# Patient Record
Sex: Female | Born: 1955 | Race: White | Hispanic: No | Smoking: Former smoker
Health system: Southern US, Community
[De-identification: ages and names within clinical notes are randomized; demographics above are authoritative.]

## PROBLEM LIST (undated history)

## (undated) ENCOUNTER — Emergency Department (HOSPITAL_BASED_OUTPATIENT_CLINIC_OR_DEPARTMENT_OTHER): Payer: BC Managed Care – PPO | Source: Home / Self Care

## (undated) DIAGNOSIS — Z8489 Family history of other specified conditions: Secondary | ICD-10-CM

## (undated) DIAGNOSIS — Z9889 Other specified postprocedural states: Secondary | ICD-10-CM

## (undated) DIAGNOSIS — R112 Nausea with vomiting, unspecified: Secondary | ICD-10-CM

## (undated) DIAGNOSIS — M1990 Unspecified osteoarthritis, unspecified site: Secondary | ICD-10-CM

## (undated) DIAGNOSIS — T8859XA Other complications of anesthesia, initial encounter: Secondary | ICD-10-CM

## (undated) DIAGNOSIS — J189 Pneumonia, unspecified organism: Secondary | ICD-10-CM

## (undated) HISTORY — PX: OTHER SURGICAL HISTORY: SHX169

## (undated) HISTORY — PX: COLONOSCOPY: SHX174

## (undated) HISTORY — PX: HIP FRACTURE SURGERY: SHX118

## (undated) HISTORY — DX: Unspecified osteoarthritis, unspecified site: M19.90

---

## 1997-07-10 ENCOUNTER — Other Ambulatory Visit: Admission: RE | Admit: 1997-07-10 | Discharge: 1997-07-10 | Payer: Self-pay | Admitting: *Deleted

## 1997-09-26 ENCOUNTER — Ambulatory Visit (HOSPITAL_COMMUNITY): Admission: RE | Admit: 1997-09-26 | Discharge: 1997-09-26 | Payer: Self-pay | Admitting: *Deleted

## 1998-04-23 ENCOUNTER — Encounter: Payer: Self-pay | Admitting: *Deleted

## 1998-04-23 ENCOUNTER — Ambulatory Visit (HOSPITAL_COMMUNITY): Admission: RE | Admit: 1998-04-23 | Discharge: 1998-04-23 | Payer: Self-pay | Admitting: *Deleted

## 1998-07-31 ENCOUNTER — Other Ambulatory Visit: Admission: RE | Admit: 1998-07-31 | Discharge: 1998-07-31 | Payer: Self-pay | Admitting: *Deleted

## 2000-02-05 ENCOUNTER — Other Ambulatory Visit: Admission: RE | Admit: 2000-02-05 | Discharge: 2000-02-05 | Payer: Self-pay | Admitting: *Deleted

## 2000-08-19 ENCOUNTER — Other Ambulatory Visit: Admission: RE | Admit: 2000-08-19 | Discharge: 2000-08-19 | Payer: Self-pay | Admitting: *Deleted

## 2000-10-01 ENCOUNTER — Other Ambulatory Visit: Admission: RE | Admit: 2000-10-01 | Discharge: 2000-10-01 | Payer: Self-pay | Admitting: *Deleted

## 2001-03-31 ENCOUNTER — Other Ambulatory Visit: Admission: RE | Admit: 2001-03-31 | Discharge: 2001-03-31 | Payer: Self-pay | Admitting: *Deleted

## 2001-04-21 ENCOUNTER — Encounter: Admission: RE | Admit: 2001-04-21 | Discharge: 2001-04-21 | Payer: Self-pay | Admitting: *Deleted

## 2001-04-21 ENCOUNTER — Encounter: Payer: Self-pay | Admitting: *Deleted

## 2002-03-08 ENCOUNTER — Other Ambulatory Visit: Admission: RE | Admit: 2002-03-08 | Discharge: 2002-03-08 | Payer: Self-pay | Admitting: *Deleted

## 2002-06-01 ENCOUNTER — Encounter: Admission: RE | Admit: 2002-06-01 | Discharge: 2002-06-01 | Payer: Self-pay | Admitting: *Deleted

## 2003-06-07 ENCOUNTER — Encounter: Admission: RE | Admit: 2003-06-07 | Discharge: 2003-06-07 | Payer: Self-pay | Admitting: *Deleted

## 2003-06-13 ENCOUNTER — Other Ambulatory Visit: Admission: RE | Admit: 2003-06-13 | Discharge: 2003-06-13 | Payer: Self-pay | Admitting: *Deleted

## 2004-01-26 ENCOUNTER — Encounter (INDEPENDENT_AMBULATORY_CARE_PROVIDER_SITE_OTHER): Payer: Self-pay | Admitting: Specialist

## 2004-01-29 ENCOUNTER — Ambulatory Visit (HOSPITAL_COMMUNITY): Admission: RE | Admit: 2004-01-29 | Discharge: 2004-01-29 | Payer: Self-pay | Admitting: *Deleted

## 2004-06-13 ENCOUNTER — Encounter: Admission: RE | Admit: 2004-06-13 | Discharge: 2004-06-13 | Payer: Self-pay | Admitting: *Deleted

## 2005-06-18 ENCOUNTER — Encounter: Admission: RE | Admit: 2005-06-18 | Discharge: 2005-06-18 | Payer: Self-pay | Admitting: *Deleted

## 2006-03-16 ENCOUNTER — Emergency Department (HOSPITAL_COMMUNITY): Admission: EM | Admit: 2006-03-16 | Discharge: 2006-03-16 | Payer: Self-pay | Admitting: *Deleted

## 2006-06-22 ENCOUNTER — Encounter: Admission: RE | Admit: 2006-06-22 | Discharge: 2006-06-22 | Payer: Self-pay | Admitting: Obstetrics and Gynecology

## 2007-02-23 ENCOUNTER — Ambulatory Visit: Payer: Self-pay | Admitting: Gastroenterology

## 2007-03-03 ENCOUNTER — Ambulatory Visit: Payer: Self-pay | Admitting: Gastroenterology

## 2007-06-23 ENCOUNTER — Encounter: Admission: RE | Admit: 2007-06-23 | Discharge: 2007-06-23 | Payer: Self-pay | Admitting: Obstetrics & Gynecology

## 2007-07-01 ENCOUNTER — Encounter: Admission: RE | Admit: 2007-07-01 | Discharge: 2007-07-01 | Payer: Self-pay | Admitting: Obstetrics & Gynecology

## 2008-07-31 ENCOUNTER — Encounter: Admission: RE | Admit: 2008-07-31 | Discharge: 2008-07-31 | Payer: Self-pay | Admitting: Obstetrics & Gynecology

## 2008-08-08 ENCOUNTER — Encounter: Admission: RE | Admit: 2008-08-08 | Discharge: 2008-08-08 | Payer: Self-pay | Admitting: Obstetrics & Gynecology

## 2009-08-09 ENCOUNTER — Encounter: Admission: RE | Admit: 2009-08-09 | Discharge: 2009-08-09 | Payer: Self-pay | Admitting: Obstetrics & Gynecology

## 2010-02-10 ENCOUNTER — Encounter: Payer: Self-pay | Admitting: Obstetrics & Gynecology

## 2010-06-07 NOTE — Op Note (Signed)
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   POSTOPERATIVE DIAGNOSES:  1.  Menorrhagia.  2.  Large uterine myoma.   PROCEDURES:  1.  Examination under anesthesia.  2.  Fractional dilatation and curettage.  3.  Diagnostic hysteroscopy.  4.  Placement of a Merina IUD.   INDICATIONS FOR THE PROCEDURE:  Mary [name] is 55 years old.  She is  having regular menstrual periods with seven days flow, one to two of them  are very, very heavy.  She has been changing a pad every hour.  We performed  a hydrosonogram in the office on September 01, 2003, and she was found to have  a very thickened endometrium.  In preparation for this procedure, she had a  menstrual period and is brought to the operating room today for [facility],  hysteroscopy and placement of a Merina IUD to try and help control her  bleeding.   FINDINGS:  As noted, in the office the patient's cervix was very high on the  anterior abdominal wall, requiring pressure on the posterior myoma and  pressure on the anterior abdominal wall to bring this cervix into view.  The  endometrial canal was quite distorted and was ultimately found to be 13 cm  in diameter by hysteroscopy rather than sounding.  There was lush  endometrium which was obtained by curettage.  There were no abnormal  findings other than this massive fibroid filling the cul-de-sac and the  posterior wall of the uterus.  There was no evidence of submucosal myoma or  intraluminal myoma.   DESCRIPTION OF PROCEDURE:  Mary [name] was brought to the operating room  with  an IV in place.  She received a gram of Ancef in the holding area.  Supine on the OR table, LMA was placed for the deliverance of general  anesthesia.  She was then placed into Allen stirrups.  Using Betadine, the  anterior abdominal wall, perineum and vagina were prepped carefully and the  bladder was drained with a red rubber catheter.  Sterile drapes were  applied.   Examination under anesthesia was performed.  A gloved attendant helped  position the cervix after I placed firm pressure on the posterior myoma and  anterior wall.  This allowed [place] to visualize the cervix, which was grasped  with a single-tooth tenaculum.  With this in vision, a weighted vaginal  speculum was placed and Marcaine was injected into the cervix at 3, 4, 7 and  8 positions using a total volume of 10 mL.  A Kevorkian curet was used to  obtain endocervical curettings.  The sound then passed to a depth of 7 cm in  a distorted  position.  The cervix was then dilated to size 13 Pratt dilator  and the hysteroscope was introduced.  I could see a cavity above 7 cm and  with careful manipulation was able to get into the cavity for visualization  of the endometrial cavity.  Photographs were taken to document the positions  of the ostia.  The hysteroscope was then used to mark the depth of the  cavity and retrieved.  The cavity was 13 cm in depth.  The Merina IUD was  opened and then inserted using a standard procedure.  However, because the  depth exceeded the depth of the Merina I measured an addition depth on the  length of the [place] and advanced the [place] IUD to the fundus.  It was  withdrawn 2 cm and then disengaged.  The apparatus was removed and the  string was cut about 5 cm long.  It is possible that this will retract more  and I will cut it when she comes into the office for her visit.  The  procedure was uncomplicated.  Specimens include endocervical curettings and  endometrial curettings.     Maur    MAJ/MEDQ  D:  01/29/2004  T:  01/29/2004  Job:  [id number]

## 2010-07-09 ENCOUNTER — Other Ambulatory Visit: Payer: Self-pay | Admitting: Obstetrics & Gynecology

## 2010-07-09 DIAGNOSIS — Z1231 Encounter for screening mammogram for malignant neoplasm of breast: Secondary | ICD-10-CM

## 2010-09-03 ENCOUNTER — Ambulatory Visit
Admission: RE | Admit: 2010-09-03 | Discharge: 2010-09-03 | Disposition: A | Discharge status: Home / Self Care | Payer: BC Managed Care – PPO | Source: Ambulatory Visit | Attending: Obstetrics & Gynecology | Admitting: Obstetrics & Gynecology

## 2010-09-03 DIAGNOSIS — Z1231 Encounter for screening mammogram for malignant neoplasm of breast: Secondary | ICD-10-CM

## 2011-07-30 ENCOUNTER — Other Ambulatory Visit: Payer: Self-pay | Admitting: Obstetrics & Gynecology

## 2011-07-30 DIAGNOSIS — Z1231 Encounter for screening mammogram for malignant neoplasm of breast: Secondary | ICD-10-CM

## 2011-09-15 ENCOUNTER — Ambulatory Visit
Admission: RE | Admit: 2011-09-15 | Discharge: 2011-09-15 | Disposition: A | Discharge status: Home / Self Care | Payer: BC Managed Care – PPO | Source: Ambulatory Visit | Attending: Obstetrics & Gynecology | Admitting: Obstetrics & Gynecology

## 2011-09-15 DIAGNOSIS — Z1231 Encounter for screening mammogram for malignant neoplasm of breast: Secondary | ICD-10-CM

## 2012-02-10 ENCOUNTER — Encounter: Payer: Self-pay | Admitting: Gastroenterology

## 2012-08-16 ENCOUNTER — Other Ambulatory Visit: Payer: Self-pay

## 2012-08-16 DIAGNOSIS — Z1231 Encounter for screening mammogram for malignant neoplasm of breast: Secondary | ICD-10-CM

## 2012-08-17 ENCOUNTER — Encounter: Payer: Self-pay | Admitting: Gastroenterology

## 2012-09-23 ENCOUNTER — Ambulatory Visit: Payer: BC Managed Care – PPO

## 2012-10-14 ENCOUNTER — Ambulatory Visit
Admission: RE | Admit: 2012-10-14 | Discharge: 2012-10-14 | Disposition: A | Discharge status: Home / Self Care | Payer: BC Managed Care – PPO | Source: Ambulatory Visit

## 2012-10-14 DIAGNOSIS — Z1231 Encounter for screening mammogram for malignant neoplasm of breast: Secondary | ICD-10-CM

## 2013-12-08 ENCOUNTER — Other Ambulatory Visit: Payer: Self-pay

## 2013-12-08 DIAGNOSIS — Z1231 Encounter for screening mammogram for malignant neoplasm of breast: Secondary | ICD-10-CM

## 2013-12-26 ENCOUNTER — Ambulatory Visit
Admission: RE | Admit: 2013-12-26 | Discharge: 2013-12-26 | Disposition: A | Discharge status: Home / Self Care | Payer: BC Managed Care – PPO | Source: Ambulatory Visit

## 2013-12-26 DIAGNOSIS — Z1231 Encounter for screening mammogram for malignant neoplasm of breast: Secondary | ICD-10-CM

## 2014-11-22 ENCOUNTER — Other Ambulatory Visit: Payer: Self-pay

## 2014-11-22 DIAGNOSIS — Z1231 Encounter for screening mammogram for malignant neoplasm of breast: Secondary | ICD-10-CM

## 2014-12-28 ENCOUNTER — Ambulatory Visit
Admission: RE | Admit: 2014-12-28 | Discharge: 2014-12-28 | Disposition: A | Discharge status: Home / Self Care | Payer: BLUE CROSS/BLUE SHIELD | Source: Ambulatory Visit

## 2014-12-28 DIAGNOSIS — Z1231 Encounter for screening mammogram for malignant neoplasm of breast: Secondary | ICD-10-CM

## 2015-02-12 ENCOUNTER — Encounter: Payer: Self-pay | Admitting: Gastroenterology

## 2015-11-12 DIAGNOSIS — Z23 Encounter for immunization: Secondary | ICD-10-CM | POA: Diagnosis not present

## 2015-12-04 ENCOUNTER — Other Ambulatory Visit: Payer: Self-pay | Admitting: Obstetrics & Gynecology

## 2015-12-04 DIAGNOSIS — Z1231 Encounter for screening mammogram for malignant neoplasm of breast: Secondary | ICD-10-CM

## 2016-01-09 ENCOUNTER — Ambulatory Visit: Payer: BLUE CROSS/BLUE SHIELD

## 2016-01-28 DIAGNOSIS — Z6822 Body mass index (BMI) 22.0-22.9, adult: Secondary | ICD-10-CM | POA: Diagnosis not present

## 2016-01-28 DIAGNOSIS — Z1151 Encounter for screening for human papillomavirus (HPV): Secondary | ICD-10-CM | POA: Diagnosis not present

## 2016-01-28 DIAGNOSIS — Z01419 Encounter for gynecological examination (general) (routine) without abnormal findings: Secondary | ICD-10-CM | POA: Diagnosis not present

## 2016-01-30 ENCOUNTER — Encounter: Payer: Self-pay | Admitting: Gastroenterology

## 2016-02-08 ENCOUNTER — Ambulatory Visit: Payer: BLUE CROSS/BLUE SHIELD

## 2016-02-14 ENCOUNTER — Ambulatory Visit (AMBULATORY_SURGERY_CENTER): Payer: Self-pay | Admitting: *Deleted

## 2016-02-14 DIAGNOSIS — Z800 Family history of malignant neoplasm of digestive organs: Secondary | ICD-10-CM

## 2016-02-14 MED ORDER — NA SULFATE-K SULFATE-MG SULF 17.5-3.13-1.6 GM/177ML PO SOLN: ORAL | 0 refills | Status: DC

## 2016-02-14 NOTE — Progress Notes (Signed)
Pt denies allergies to eggs or soy products. Denies difficulty with sedation or anesthesia. Denies any diet or weight loss medications. Denies use of supplemental oxygen.  Emmi instructions given for procedure.  

## 2016-02-15 ENCOUNTER — Encounter: Payer: Self-pay | Admitting: Gastroenterology

## 2016-02-28 ENCOUNTER — Encounter: Payer: Self-pay | Admitting: Gastroenterology

## 2016-02-28 ENCOUNTER — Ambulatory Visit (AMBULATORY_SURGERY_CENTER): Payer: BLUE CROSS/BLUE SHIELD | Admitting: Gastroenterology

## 2016-02-28 VITALS — BP 115/57 | HR 69 | Temp 96.6°F | Resp 18

## 2016-02-28 DIAGNOSIS — Z1212 Encounter for screening for malignant neoplasm of rectum: Secondary | ICD-10-CM

## 2016-02-28 DIAGNOSIS — Z800 Family history of malignant neoplasm of digestive organs: Secondary | ICD-10-CM | POA: Diagnosis not present

## 2016-02-28 DIAGNOSIS — D122 Benign neoplasm of ascending colon: Secondary | ICD-10-CM | POA: Diagnosis not present

## 2016-02-28 DIAGNOSIS — D124 Benign neoplasm of descending colon: Secondary | ICD-10-CM | POA: Diagnosis not present

## 2016-02-28 DIAGNOSIS — Z1211 Encounter for screening for malignant neoplasm of colon: Secondary | ICD-10-CM | POA: Diagnosis not present

## 2016-02-28 MED ORDER — SODIUM CHLORIDE 0.9 % IV SOLN: 500.0000 mL | INTRAVENOUS | Status: DC

## 2016-02-28 NOTE — Patient Instructions (Signed)
Impression/Recommendations:  Polyp handout given to patient. Hemorrhoid handout given to patient.  Repeat colonoscopy in 3 years for surveillance based on pathology results.  YOU HAD AN ENDOSCOPIC PROCEDURE TODAY AT [facility]:   Refer to the procedure report that was given to you for any specific questions about what was found during the examination.  If the procedure report does not answer your questions, please call your gastroenterologist to clarify.  If you requested that your care partner not be given the details of your procedure findings, then the procedure report has been included in a sealed envelope for you to review at your convenience later.  YOU SHOULD EXPECT: Some feelings of bloating in the abdomen. Passage of more gas than usual.  Walking can help get rid of the air that was put into your GI tract during the procedure and reduce the bloating. If you had a lower endoscopy (such as a colonoscopy or flexible sigmoidoscopy) you may notice spotting of blood in your stool or on the toilet paper. If you underwent a bowel prep for your procedure, you may not have a normal bowel movement for a few days.  Please Note:  You might notice some irritation and congestion in your nose or some drainage.  This is from the oxygen used during your procedure.  There is no need for concern and it should clear up in a day or so.  SYMPTOMS TO REPORT IMMEDIATELY:   Following lower endoscopy (colonoscopy or flexible sigmoidoscopy):  Excessive amounts of blood in the stool  Significant tenderness or worsening of abdominal pains  Swelling of the abdomen that is new, acute  Fever of 100F or higher For urgent or emergent issues, a gastroenterologist can be reached at any hour by calling [phone].   DIET:  We do recommend a small meal at first, but then you may proceed to your regular diet.  Drink plenty of fluids but you should avoid alcoholic beverages for 24 hours.  ACTIVITY:   You should plan to take it easy for the rest of today and you should NOT DRIVE or use heavy machinery until tomorrow (because of the sedation medicines used during the test).    FOLLOW UP: Our staff will call the number listed on your records the next business day following your procedure to check on you and address any questions or concerns that you may have regarding the information given to you following your procedure. If we do not reach you, we will leave a message.  However, if you are feeling well and you are not experiencing any problems, there is no need to return our call.  We will assume that you have returned to your regular daily activities without incident.  If any biopsies were taken you will be contacted by phone or by letter within the next 1-3 weeks.  Please call us at [phone] if you have not heard about the biopsies in 3 weeks.    SIGNATURES/CONFIDENTIALITY: You and/or your care partner have signed paperwork which will be entered into your electronic medical record.  These signatures attest to the fact that that the information above on your After Visit Summary has been reviewed and is understood.  Full responsibility of the confidentiality of this discharge information lies with you and/or your care-partner.

## 2016-02-28 NOTE — Progress Notes (Signed)
Called to room to assist during endoscopic procedure.  Patient ID and intended procedure confirmed with present staff. Received instructions for my participation in the procedure from the performing physician.  

## 2016-02-28 NOTE — Progress Notes (Signed)
To recovery, report to Sechler, RN, VSS 

## 2016-02-28 NOTE — Op Note (Signed)
Mary [name] Patient Name: Mary [name] Procedure Date: 02/28/2016 8:08 AM MRN: [id number] Endoscopist: Mauri Pole , MD Age: 61 Referring MD:  Date of Birth: Aug 02, 1955 Gender: Female Account #: [id number] Procedure:                Colonoscopy Indications:              Screening in patient at increased risk: Family                            history of 1st-degree relative with colorectal                            cancer before age 25 years Medicines:                Monitored Anesthesia Care Procedure:                Pre-Anesthesia Assessment:                           - Prior to the procedure, a History and Physical                            was performed, and patient medications and                            allergies were reviewed. The patient's tolerance of                            previous anesthesia was also reviewed. The risks                            and benefits of the procedure and the sedation                            options and risks were discussed with the patient.                            All questions were answered, and informed consent                            was obtained. Prior Anticoagulants: The patient has                            taken no previous anticoagulant or antiplatelet                            agents. ASA Grade Assessment: II - A patient with                            mild systemic disease. After reviewing the risks                            and benefits, the patient was deemed in  satisfactory condition to undergo the procedure.                           After obtaining informed consent, the colonoscope                            was passed under direct vision. Throughout the                            procedure, the patient's blood pressure, pulse, and                            oxygen saturations were monitored continuously. The                            Model CF-HQ190L [id number]) scope  was introduced                            through the anus and advanced to the the terminal                            ileum, with identification of the appendiceal                            orifice and IC valve. The colonoscopy was performed                            without difficulty. The patient tolerated the                            procedure well. The quality of the bowel                            preparation was excellent. The terminal ileum,                            ileocecal valve, appendiceal orifice, and rectum                            were photographed. Scope In: 8:12:05 AM Scope Out: 8:38:55 AM Scope Withdrawal Time: 0 hours 12 minutes 24 seconds  Total Procedure Duration: 0 hours 26 minutes 50 seconds  Findings:                 The perianal and digital rectal examinations were                            normal.                           Three sessile polyps were found in the descending                            colon and ascending colon. The polyps were 5 to 12  mm in size. These polyps were removed with a cold                            snare. Resection and retrieval were complete.                           Non-bleeding internal hemorrhoids were found during                            retroflexion. The hemorrhoids were small.                           The exam was otherwise without abnormality. Complications:            No immediate complications. Estimated Blood Loss:     Estimated blood loss was minimal. Impression:               - Three 5 to 12 mm polyps in the descending colon                            and in the ascending colon, removed with a cold                            snare. Resected and retrieved.                           - Non-bleeding internal hemorrhoids.                           - The examination was otherwise normal. Recommendation:           - Patient has a contact number available for                             emergencies. The signs and symptoms of potential                            delayed complications were discussed with the                            patient. Return to normal activities tomorrow.                            Written discharge instructions were provided to the                            patient.                           - Resume previous diet.                           - Continue present medications.                           - Await pathology results.                           -  Repeat colonoscopy in 3 years for surveillance                            based on pathology results. Mauri Pole, MD 02/28/2016 8:50:26 AM This report has been signed electronically.

## 2016-02-28 NOTE — Progress Notes (Signed)
Pt's states no medical or surgical changes since previsit or office visit.  No home oxygen used or hx of sleep apnea 

## 2016-02-29 ENCOUNTER — Telehealth: Payer: Self-pay | Admitting: *Deleted

## 2016-02-29 NOTE — Telephone Encounter (Signed)
  Follow up Call-  Call back number 02/28/2016  Post procedure Call Back phone  # [phone]  Permission to leave phone message Yes  Some recent data might be hidden     No answer at # given.  Left message on VM.

## 2016-03-03 ENCOUNTER — Ambulatory Visit
Admission: RE | Admit: 2016-03-03 | Discharge: 2016-03-03 | Disposition: A | Discharge status: Home / Self Care | Payer: BLUE CROSS/BLUE SHIELD | Source: Ambulatory Visit | Attending: Obstetrics & Gynecology | Admitting: Obstetrics & Gynecology

## 2016-03-03 ENCOUNTER — Telehealth: Payer: Self-pay | Admitting: *Deleted

## 2016-03-03 DIAGNOSIS — Z1231 Encounter for screening mammogram for malignant neoplasm of breast: Secondary | ICD-10-CM

## 2016-03-03 NOTE — Telephone Encounter (Signed)
Second call back attempted no answer. SM

## 2016-03-04 ENCOUNTER — Encounter: Payer: Self-pay | Admitting: Gastroenterology

## 2017-01-03 DIAGNOSIS — H6123 Impacted cerumen, bilateral: Secondary | ICD-10-CM | POA: Diagnosis not present

## 2017-01-05 ENCOUNTER — Ambulatory Visit: Payer: Self-pay | Admitting: Physician Assistant

## 2017-01-12 DIAGNOSIS — H900 Conductive hearing loss, bilateral: Secondary | ICD-10-CM | POA: Diagnosis not present

## 2017-01-12 DIAGNOSIS — H6123 Impacted cerumen, bilateral: Secondary | ICD-10-CM | POA: Diagnosis not present

## 2017-02-12 ENCOUNTER — Other Ambulatory Visit: Payer: Self-pay | Admitting: Obstetrics and Gynecology

## 2017-02-12 DIAGNOSIS — Z1231 Encounter for screening mammogram for malignant neoplasm of breast: Secondary | ICD-10-CM

## 2017-03-05 ENCOUNTER — Ambulatory Visit
Admission: RE | Admit: 2017-03-05 | Discharge: 2017-03-05 | Disposition: A | Discharge status: Home / Self Care | Payer: BLUE CROSS/BLUE SHIELD | Source: Ambulatory Visit | Attending: Obstetrics and Gynecology | Admitting: Obstetrics and Gynecology

## 2017-03-05 DIAGNOSIS — Z1231 Encounter for screening mammogram for malignant neoplasm of breast: Secondary | ICD-10-CM | POA: Diagnosis not present

## 2017-04-24 DIAGNOSIS — Z01419 Encounter for gynecological examination (general) (routine) without abnormal findings: Secondary | ICD-10-CM | POA: Diagnosis not present

## 2017-04-24 DIAGNOSIS — Z6821 Body mass index (BMI) 21.0-21.9, adult: Secondary | ICD-10-CM | POA: Diagnosis not present

## 2018-01-26 ENCOUNTER — Other Ambulatory Visit: Payer: Self-pay | Admitting: Obstetrics and Gynecology

## 2018-01-26 DIAGNOSIS — Z1231 Encounter for screening mammogram for malignant neoplasm of breast: Secondary | ICD-10-CM

## 2018-03-09 ENCOUNTER — Ambulatory Visit
Admission: RE | Admit: 2018-03-09 | Discharge: 2018-03-09 | Disposition: A | Discharge status: Home / Self Care | Payer: BLUE CROSS/BLUE SHIELD | Source: Ambulatory Visit | Attending: Obstetrics and Gynecology | Admitting: Obstetrics and Gynecology

## 2018-03-09 DIAGNOSIS — Z1231 Encounter for screening mammogram for malignant neoplasm of breast: Secondary | ICD-10-CM | POA: Diagnosis not present

## 2018-10-23 DIAGNOSIS — Z1159 Encounter for screening for other viral diseases: Secondary | ICD-10-CM | POA: Diagnosis not present

## 2018-10-23 DIAGNOSIS — J029 Acute pharyngitis, unspecified: Secondary | ICD-10-CM | POA: Diagnosis not present

## 2019-02-03 ENCOUNTER — Other Ambulatory Visit: Payer: Self-pay | Admitting: Obstetrics and Gynecology

## 2019-02-03 DIAGNOSIS — Z1231 Encounter for screening mammogram for malignant neoplasm of breast: Secondary | ICD-10-CM

## 2019-03-16 ENCOUNTER — Ambulatory Visit: Payer: BLUE CROSS/BLUE SHIELD

## 2019-03-29 DIAGNOSIS — Z20822 Contact with and (suspected) exposure to covid-19: Secondary | ICD-10-CM | POA: Diagnosis not present

## 2019-06-27 ENCOUNTER — Other Ambulatory Visit: Payer: Self-pay

## 2019-06-27 ENCOUNTER — Ambulatory Visit
Admission: RE | Admit: 2019-06-27 | Discharge: 2019-06-27 | Disposition: A | Discharge status: Home / Self Care | Payer: BC Managed Care – PPO | Source: Ambulatory Visit | Attending: Obstetrics and Gynecology | Admitting: Obstetrics and Gynecology

## 2019-06-27 DIAGNOSIS — Z1231 Encounter for screening mammogram for malignant neoplasm of breast: Secondary | ICD-10-CM | POA: Diagnosis not present

## 2019-06-29 ENCOUNTER — Other Ambulatory Visit: Payer: Self-pay | Admitting: Obstetrics and Gynecology

## 2019-06-29 DIAGNOSIS — R928 Other abnormal and inconclusive findings on diagnostic imaging of breast: Secondary | ICD-10-CM

## 2019-07-04 ENCOUNTER — Other Ambulatory Visit: Payer: Self-pay | Admitting: Obstetrics and Gynecology

## 2019-07-04 ENCOUNTER — Other Ambulatory Visit: Payer: Self-pay

## 2019-07-04 ENCOUNTER — Ambulatory Visit
Admission: RE | Admit: 2019-07-04 | Discharge: 2019-07-04 | Disposition: A | Discharge status: Home / Self Care | Payer: BC Managed Care – PPO | Source: Ambulatory Visit | Attending: Obstetrics and Gynecology | Admitting: Obstetrics and Gynecology

## 2019-07-04 DIAGNOSIS — R921 Mammographic calcification found on diagnostic imaging of breast: Secondary | ICD-10-CM | POA: Diagnosis not present

## 2019-07-04 DIAGNOSIS — R928 Other abnormal and inconclusive findings on diagnostic imaging of breast: Secondary | ICD-10-CM

## 2019-09-08 DIAGNOSIS — Z01419 Encounter for gynecological examination (general) (routine) without abnormal findings: Secondary | ICD-10-CM | POA: Diagnosis not present

## 2019-09-08 DIAGNOSIS — Z6820 Body mass index (BMI) 20.0-20.9, adult: Secondary | ICD-10-CM | POA: Diagnosis not present

## 2019-09-08 DIAGNOSIS — Z1151 Encounter for screening for human papillomavirus (HPV): Secondary | ICD-10-CM | POA: Diagnosis not present

## 2019-12-03 ENCOUNTER — Ambulatory Visit: Payer: Self-pay | Attending: Internal Medicine

## 2019-12-03 DIAGNOSIS — Z23 Encounter for immunization: Secondary | ICD-10-CM

## 2019-12-03 NOTE — Progress Notes (Signed)
   Covid-19 Vaccination Clinic  Name:  Mary [name]    MRN: [id number] DOB: [birth year]  12/03/2019  Ms. Mary [name] was observed post Covid-19 immunization for 15 minutes without incident. She was provided with Vaccine Information Sheet and instruction to access the V-Safe system.   Ms. Mary [name] was instructed to call 911 with any severe reactions post vaccine: Marland Kitchen Difficulty breathing  . Swelling of face and throat  . A fast heartbeat  . A bad rash all over body  . Dizziness and weakness   Immunizations Administered    Name Date Dose VIS Date Route   Pfizer COVID-19 Vaccine 12/03/2019  1:53 PM 0.3 mL 11/09/2019 Intramuscular   Manufacturer: [place]   Lot: [id number]   [place]: [id number]

## 2020-02-22 DIAGNOSIS — H11153 Pinguecula, bilateral: Secondary | ICD-10-CM | POA: Diagnosis not present

## 2020-02-22 DIAGNOSIS — H25813 Combined forms of age-related cataract, bilateral: Secondary | ICD-10-CM | POA: Diagnosis not present

## 2020-03-22 ENCOUNTER — Encounter: Payer: Self-pay | Admitting: Gastroenterology

## 2020-03-30 DIAGNOSIS — Z20822 Contact with and (suspected) exposure to covid-19: Secondary | ICD-10-CM | POA: Diagnosis not present

## 2020-04-03 ENCOUNTER — Other Ambulatory Visit: Payer: Self-pay | Admitting: Obstetrics and Gynecology

## 2020-04-03 ENCOUNTER — Ambulatory Visit
Admission: RE | Admit: 2020-04-03 | Discharge: 2020-04-03 | Disposition: A | Discharge status: Home / Self Care | Payer: BC Managed Care – PPO | Source: Ambulatory Visit | Attending: Obstetrics and Gynecology | Admitting: Obstetrics and Gynecology

## 2020-04-03 ENCOUNTER — Other Ambulatory Visit: Payer: Self-pay

## 2020-04-03 DIAGNOSIS — R921 Mammographic calcification found on diagnostic imaging of breast: Secondary | ICD-10-CM | POA: Diagnosis not present

## 2020-04-03 DIAGNOSIS — R928 Other abnormal and inconclusive findings on diagnostic imaging of breast: Secondary | ICD-10-CM

## 2020-04-10 ENCOUNTER — Ambulatory Visit (AMBULATORY_SURGERY_CENTER): Payer: Self-pay | Admitting: *Deleted

## 2020-04-10 ENCOUNTER — Other Ambulatory Visit: Payer: Self-pay

## 2020-04-10 VITALS — Ht 62.0 in | Wt 121.0 lb

## 2020-04-10 DIAGNOSIS — Z800 Family history of malignant neoplasm of digestive organs: Secondary | ICD-10-CM

## 2020-04-10 DIAGNOSIS — Z86010 Personal history of colonic polyps: Secondary | ICD-10-CM

## 2020-04-10 MED ORDER — SUPREP BOWEL PREP KIT 17.5-3.13-1.6 GM/177ML PO SOLN: 1.0000 | Freq: Once | ORAL | 0 refills | Status: AC

## 2020-04-10 NOTE — Progress Notes (Signed)
  No trouble with anesthesia, denies being told they were difficult to intubate, or hx/fam hx of malignant hyperthermia per pt   No egg or soy allergy  No home oxygen use   No medications for weight loss taken  Pt denies constipation issues  Pt informed that we do not do prior authorizations for prep  Suprep coupon given

## 2020-04-24 ENCOUNTER — Encounter: Payer: Self-pay | Admitting: Gastroenterology

## 2020-04-26 ENCOUNTER — Ambulatory Visit (AMBULATORY_SURGERY_CENTER): Payer: BC Managed Care – PPO | Admitting: Gastroenterology

## 2020-04-26 ENCOUNTER — Other Ambulatory Visit: Payer: Self-pay

## 2020-04-26 ENCOUNTER — Encounter: Payer: Self-pay | Admitting: Gastroenterology

## 2020-04-26 VITALS — BP 133/76 | HR 66 | Temp 97.1°F | Resp 17 | Ht 62.0 in | Wt 121.0 lb

## 2020-04-26 DIAGNOSIS — D122 Benign neoplasm of ascending colon: Secondary | ICD-10-CM

## 2020-04-26 DIAGNOSIS — Z86010 Personal history of colonic polyps: Secondary | ICD-10-CM

## 2020-04-26 DIAGNOSIS — Z800 Family history of malignant neoplasm of digestive organs: Secondary | ICD-10-CM | POA: Diagnosis not present

## 2020-04-26 DIAGNOSIS — Z1211 Encounter for screening for malignant neoplasm of colon: Secondary | ICD-10-CM | POA: Diagnosis not present

## 2020-04-26 DIAGNOSIS — D123 Benign neoplasm of transverse colon: Secondary | ICD-10-CM

## 2020-04-26 MED ORDER — SODIUM CHLORIDE 0.9 % IV SOLN: 500.0000 mL | Freq: Once | INTRAVENOUS | Status: DC

## 2020-04-26 NOTE — Progress Notes (Signed)
To PACU, VSS. Report to [email or web address] 

## 2020-04-26 NOTE — Op Note (Signed)
[name] Procedure Date: 04/26/2020 8:05 AM MRN: [id number] Endoscopist: Mauri Pole , MD Age: 65 Referring MD:  Date of Birth: Apr 08, 1955 Gender: Female Account #: [id number] Procedure:                Colonoscopy Indications:              Screening in patient at increased risk: Family                            history of 1st-degree relative with colorectal                            cancer, High risk colon cancer surveillance:                            Personal history of colonic polyps, High risk colon                            cancer surveillance: Personal history of adenoma                            (10 mm or greater in size), High risk colon cancer                            surveillance: Personal history of multiple (3 or                            more) adenomas Medicines:                Monitored Anesthesia Care Procedure:                Pre-Anesthesia Assessment:                           - Prior to the procedure, a History and Physical                            was performed, and patient medications and                            allergies were reviewed. The patient's tolerance of                            previous anesthesia was also reviewed. The risks                            and benefits of the procedure and the sedation                            options and risks were discussed with the patient.                            All questions were answered, and informed consent  was obtained. Prior Anticoagulants: The patient has                            taken no previous anticoagulant or antiplatelet                            agents. ASA Grade Assessment: II - A patient with                            mild systemic disease. After reviewing the risks                            and benefits, the patient was deemed in                            satisfactory condition to undergo the procedure.                            After obtaining informed consent, the colonoscope                            was passed under direct vision. Throughout the                            procedure, the patient's blood pressure, pulse, and                            oxygen saturations were monitored continuously. The                            Olympus PCF-[id number] (#[id number]) Colonoscope was                            introduced through the anus and advanced to the the                            cecum, identified by appendiceal orifice and                            ileocecal valve. The colonoscopy was performed                            without difficulty. The patient tolerated the                            procedure well. The quality of the bowel                            preparation was excellent. The ileocecal valve,                            appendiceal orifice, and rectum were photographed. Scope In: 8:13:01 AM Scope Out: 8:29:32 AM Scope Withdrawal Time: 0 hours 11 minutes 25 seconds  Total Procedure Duration: 0  hours 16 minutes 31 seconds  Findings:                 The perianal and digital rectal examinations were                            normal.                           Four sessile polyps were found in the transverse                            colon and ascending colon. The polyps were 5 to 8                            mm in size. These polyps were removed with a cold                            snare. Resection and retrieval were complete.                           Non-bleeding external and internal hemorrhoids were                            found during retroflexion. The hemorrhoids were                            medium-sized. Complications:            No immediate complications. Estimated Blood Loss:     Estimated blood loss was minimal. Impression:               - Four 5 to 8 mm polyps in the transverse colon and                            in the ascending colon, removed with a cold  snare.                            Resected and retrieved.                           - Non-bleeding external and internal hemorrhoids. Recommendation:           - Patient has a contact number available for                            emergencies. The signs and symptoms of potential                            delayed complications were discussed with the                            patient. Return to normal activities tomorrow.                            Written discharge instructions were provided to the  patient.                           - Resume previous diet.                           - Continue present medications.                           - Await pathology results.                           - Repeat colonoscopy in 3 years for surveillance                            based on pathology results. Mauri Pole, MD 04/26/2020 8:34:51 AM This report has been signed electronically.

## 2020-04-26 NOTE — Progress Notes (Signed)
Pt's states no medical or surgical changes since previsit or office visit.  VS taken by [place]

## 2020-04-26 NOTE — Progress Notes (Signed)
Called to room to assist during endoscopic procedure.  Patient ID and intended procedure confirmed with present staff. Received instructions for my participation in the procedure from the performing physician.  

## 2020-04-26 NOTE — Patient Instructions (Signed)
YOU HAD AN ENDOSCOPIC PROCEDURE TODAY AT THE [facility]:   Refer to the procedure report that was given to you for any specific questions about what was found during the examination.  If the procedure report does not answer your questions, please call your gastroenterologist to clarify.  If you requested that your care partner not be given the details of your procedure findings, then the procedure report has been included in a sealed envelope for you to review at your convenience later.  YOU SHOULD EXPECT: Some feelings of bloating in the abdomen. Passage of more gas than usual.  Walking can help get rid of the air that was put into your GI tract during the procedure and reduce the bloating. If you had a lower endoscopy (such as a colonoscopy or flexible sigmoidoscopy) you may notice spotting of blood in your stool or on the toilet paper. If you underwent a bowel prep for your procedure, you may not have a normal bowel movement for a few days.  Please Note:  You might notice some irritation and congestion in your nose or some drainage.  This is from the oxygen used during your procedure.  There is no need for concern and it should clear up in a day or so.  SYMPTOMS TO REPORT IMMEDIATELY:   Following lower endoscopy (colonoscopy or flexible sigmoidoscopy):  Excessive amounts of blood in the stool  Significant tenderness or worsening of abdominal pains  Swelling of the abdomen that is new, acute  Fever of 100F or higher  For urgent or emergent issues, a gastroenterologist can be reached at any hour by calling [phone]. Do not use MyChart messaging for urgent concerns.    DIET:  We do recommend a small meal at first, but then you may proceed to your regular diet.  Drink plenty of fluids but you should avoid alcoholic beverages for 24 hours.  ACTIVITY:  You should plan to take it easy for the rest of today and you should NOT DRIVE or use heavy machinery until tomorrow (because  of the sedation medicines used during the test).    FOLLOW UP: Our staff will call the number listed on your records 48-72 hours following your procedure to check on you and address any questions or concerns that you may have regarding the information given to you following your procedure. If we do not reach you, we will leave a message.  We will attempt to reach you two times.  During this call, we will ask if you have developed any symptoms of COVID 19. If you develop any symptoms (ie: fever, flu-like symptoms, shortness of breath, cough etc.) before then, please call [phone].  If you test positive for Covid 19 in the 2 weeks post procedure, please call and report this information to us.    If any biopsies were taken you will be contacted by phone or by letter within the next 1-3 weeks.  Please call us at [phone] if you have not heard about the biopsies in 3 weeks.    SIGNATURES/CONFIDENTIALITY: You and/or your care partner have signed paperwork which will be entered into your electronic medical record.  These signatures attest to the fact that that the information above on your After Visit Summary has been reviewed and is understood.  Full responsibility of the confidentiality of this discharge information lies with you and/or your care-partner. 

## 2020-04-30 ENCOUNTER — Telehealth: Payer: Self-pay | Admitting: *Deleted

## 2020-04-30 DIAGNOSIS — M5416 Radiculopathy, lumbar region: Secondary | ICD-10-CM | POA: Diagnosis not present

## 2020-04-30 DIAGNOSIS — M25551 Pain in right hip: Secondary | ICD-10-CM | POA: Diagnosis not present

## 2020-04-30 DIAGNOSIS — G8929 Other chronic pain: Secondary | ICD-10-CM | POA: Diagnosis not present

## 2020-04-30 DIAGNOSIS — M25552 Pain in left hip: Secondary | ICD-10-CM | POA: Diagnosis not present

## 2020-04-30 NOTE — Telephone Encounter (Signed)
Follow up call made. 

## 2020-04-30 NOTE — Telephone Encounter (Signed)
  Follow up Call-  Call back number 04/26/2020  Post procedure Call Back phone  # [phone]  Permission to leave phone message Yes  Some recent data might be hidden     Patient questions:  Do you have a fever, pain , or abdominal swelling? No. Pain Score  0 *  Have you tolerated food without any problems? Yes.    Have you been able to return to your normal activities? Yes.    Do you have any questions about your discharge instructions: Diet   No. Medications  No. Follow up visit  No.  Do you have questions or concerns about your Care? No.  Actions: * If pain score is 4 or above: No action needed, pain <4.  1. Have you developed a fever since your procedure? no  2.   Have you had an respiratory symptoms (SOB or cough) since your procedure? no  3.   Have you tested positive for COVID 19 since your procedure no  4.   Have you had any family members/close contacts diagnosed with the COVID 19 since your procedure?  no   If yes to any of these questions please route to Joylene John, RN and Joella Prince, RN

## 2020-05-15 DIAGNOSIS — M47816 Spondylosis without myelopathy or radiculopathy, lumbar region: Secondary | ICD-10-CM | POA: Diagnosis not present

## 2020-05-15 DIAGNOSIS — M5386 Other specified dorsopathies, lumbar region: Secondary | ICD-10-CM | POA: Diagnosis not present

## 2020-05-15 DIAGNOSIS — M4316 Spondylolisthesis, lumbar region: Secondary | ICD-10-CM | POA: Diagnosis not present

## 2020-05-15 DIAGNOSIS — M48061 Spinal stenosis, lumbar region without neurogenic claudication: Secondary | ICD-10-CM | POA: Diagnosis not present

## 2020-05-15 NOTE — Progress Notes (Signed)
[facility] [address] [place] Phone: [phone] Subjective:   I Mary [name] am serving as a Education administrator for Dr. Hulan Saas.  This visit occurred during the SARS-CoV-2 public health emergency.  Safety protocols were in place, including screening questions prior to the visit, additional usage of staff PPE, and extensive cleaning of exam room while observing appropriate contact time as indicated for disinfecting solutions.   I'm seeing this patient by the request  of:  Dr. Jannifer Franklin MD  CC: Back and hip pain  [id number]  Mary [name] is a 65 y.o. female coming in with complaint of back and bilateral hip pain. MRI yesterday. States that today it is right sided hip pain that radiates laterally that hurts the most.   Onset- Chronic  Location -  Duration- pain all the time Character- sharp  Aggravating factors- lifting, walking, horse riding  Reliving factors-  Therapies tried- her horse's prednisone 5 mg a day, ibuprofen  Severity- 5/10 at its worse      No past medical history on file. Past Surgical History:  Procedure Laterality Date  . COLONOSCOPY    . HIP FRACTURE SURGERY Left    Social History   Socioeconomic History  . Marital status: Divorced    Spouse name: Not on file  . Number of children: Not on file  . Years of education: Not on file  . Highest education level: Not on file  Occupational History  . Not on file  Tobacco Use  . Smoking status: Former Research scientist (life sciences)  . Smokeless tobacco: Never Used  Vaping Use  . Vaping Use: Never used  Substance and Sexual Activity  . Alcohol use: Yes    Alcohol/week: 7.0 standard drinks    Types: 7 Glasses of wine per week  . Drug use: No  . Sexual activity: Not on file  Other Topics Concern  . Not on file  Social History Narrative  . Not on file   Social Determinants of Health   Financial Resource Strain: Not on file  Food Insecurity: Not on file  Transportation Needs:  Not on file  Physical Activity: Not on file  Stress: Not on file  Social Connections: Not on file   No Known Allergies Family History  Problem Relation Age of Onset  . Colon cancer Mother   . Breast cancer Mother        early 90's  . Esophageal cancer Neg Hx   . Rectal cancer Neg Hx   . Stomach cancer Neg Hx     Current Outpatient Medications (Endocrine & Metabolic):  Marland Kitchen  PREDNISONE PO, Take by mouth. PRN inflammation     Current Outpatient Medications (Respiratory):  .  diphenhydrAMINE (BENADRYL) 25 MG tablet, Take 25 mg by mouth every 6 (six) hours as needed.   Current Outpatient Medications (Analgesics):  .  ibuprofen (ADVIL,MOTRIN) 200 MG tablet, Take 200 mg by mouth every 6 (six) hours as needed.     Current Outpatient Medications (Other):  Marland Kitchen  Black Cohosh-SoyIsoflav-Magnol (ESTROVEN MENOPAUSE RELIEF PO), Take by mouth. .  gabapentin (NEURONTIN) 100 MG capsule, Take 2 capsules (200 mg total) by mouth at bedtime. .  Nutritional Supplements (ESTROVEN PO), Take by mouth daily.  Current Facility-Administered Medications (Other):  .  0.9 %  sodium chloride infusion   Reviewed prior external information including notes and imaging from  primary care provider As well as notes that were available from care everywhere and other healthcare systems.  Past medical history, social, surgical and family history all reviewed in electronic medical record.  No pertanent information unless stated regarding to the chief complaint.   Review of Systems:  No headache, visual changes, nausea, vomiting, diarrhea, constipation, dizziness, abdominal pain, skin rash, fevers, chills, night sweats, weight loss, swollen lymph nodes, body aches, joint swelling, chest pain, shortness of breath, mood changes. POSITIVE muscle aches, body aches  Objective  Blood pressure 112/72, pulse 82, height 5\' 2"  (1.575 m), weight 120 lb (54.4 kg), SpO2 99 %.   General: No apparent distress alert and  oriented x3 mood and affect normal, dressed appropriately.  HEENT: Pupils equal, extraocular movements intact  Respiratory: Patient's speak in full sentences and does not appear short of breath  Cardiovascular: No lower extremity edema, non tender, no erythema  Gait significantly abnormal with patient swinging the right hip forward.  He does walk also with the right foot and external rotation. Right hip exam does show significant decreased range of motion in internal and external range of motion.  Only has 5 to 10 degrees of internal.  Negative straight leg test but patient does have some mild tightness of the hamstring compared to the contralateral side.  Deep tendon reflexes though are intact.  Tender to palpation more in the gluteal area than truly in the back itself. Lower back does show some loss of lordosis.  Some degenerative scoliosis noted. Patient does have some weakness noted with the hip flexor as well as knee extension on the right compared to the left.  Spent significant time with patient trying to review the MRI which we had difficulty, new x-rays of the hip, and discussing different treatment options with her as well as physical exam for greater than 46 minutes.   Impression and Recommendations:     The above documentation has been reviewed and is accurate and complete Lyndal Pulley, DO

## 2020-05-16 ENCOUNTER — Encounter: Payer: Self-pay | Admitting: Gastroenterology

## 2020-05-16 ENCOUNTER — Ambulatory Visit: Payer: BC Managed Care – PPO | Admitting: Family Medicine

## 2020-05-16 ENCOUNTER — Other Ambulatory Visit: Payer: Self-pay

## 2020-05-16 ENCOUNTER — Encounter: Payer: Self-pay | Admitting: Family Medicine

## 2020-05-16 ENCOUNTER — Ambulatory Visit (INDEPENDENT_AMBULATORY_CARE_PROVIDER_SITE_OTHER): Payer: BC Managed Care – PPO

## 2020-05-16 VITALS — BP 112/72 | HR 82 | Ht 62.0 in | Wt 120.0 lb

## 2020-05-16 DIAGNOSIS — M25551 Pain in right hip: Secondary | ICD-10-CM

## 2020-05-16 DIAGNOSIS — M1611 Unilateral primary osteoarthritis, right hip: Secondary | ICD-10-CM | POA: Diagnosis not present

## 2020-05-16 DIAGNOSIS — M5416 Radiculopathy, lumbar region: Secondary | ICD-10-CM

## 2020-05-16 DIAGNOSIS — Z8673 Personal history of transient ischemic attack (TIA), and cerebral infarction without residual deficits: Secondary | ICD-10-CM

## 2020-05-16 MED ORDER — GABAPENTIN 100 MG PO CAPS: 200.0000 mg | ORAL_CAPSULE | Freq: Every day | ORAL | 3 refills | Status: DC

## 2020-05-16 NOTE — Assessment & Plan Note (Signed)
Patient is having some lumbar radiculopathy.  Noticing the patient does have weakness of the right hip girdle.  Unfortunately found to have significant amount of arthritic changes of the right hip that is likely contributing to some of the abnormal gait.  Patient on x-rays of the hip also show that patient has a severely calcified uterus that is likely contributing to abnormality noted on x-ray that was reviewed today.  Encouraged her to follow-up with her gynecologist.  Patient denies any fevers, chills, any abnormal weight loss.  Patient did bring MRI CD and today that unfortunately we were unable to get pictures pulled up.  X-rays of the hips noted does show that patient does have significant degenerative disc disease of the lumbar spine that likely does contribute to more of a nerve impingement.  Patient will be started on gabapentin and once we do have a final read or pictures we can see we will consider the possibility of epidurals.  Patient then will continue with some home exercises at this point.  Follow-up with me again in 4 to 6 weeks.

## 2020-05-16 NOTE — Assessment & Plan Note (Signed)
Significant arthritis of the right hip.  Discussed with patient in great length about icing regimen and home exercises.  Patient has had fracture on the contralateral side with moderate arthritis as well.  Patient does have some weakness of the leg though that seems to be potentially secondary to more of the back but deep tendon reflexes are likely intact.  Patient will be referred to orthopedic surgery to discuss potential surgical intervention.

## 2020-05-16 NOTE — Patient Instructions (Addendum)
Good to see you Back exercises listen to your body Referral to [facility] for hip to discuss  I am waiting read for Mri and I will send you a message with plan afterwords Gabapentin 200 mg at night it can make you drowsy if so go to 1 pill a night  See me again in 6 weeks talk to gynecologist

## 2020-05-17 ENCOUNTER — Other Ambulatory Visit: Payer: Self-pay

## 2020-05-17 ENCOUNTER — Telehealth: Payer: Self-pay | Admitting: Family Medicine

## 2020-05-17 DIAGNOSIS — M1611 Unilateral primary osteoarthritis, right hip: Secondary | ICD-10-CM

## 2020-05-17 NOTE — Telephone Encounter (Signed)
[place] [phone]) from [place] called, pt is scheduled for MRI 5/3 but authorization is for [facility] and not [facility].

## 2020-05-18 NOTE — Telephone Encounter (Signed)
I have not even done a pre cert yet on this patient so I am not sure how it is approved for [facility] unless another place has done one. I will do the pre cert today for [facility] that was ordered by our office

## 2020-05-22 ENCOUNTER — Ambulatory Visit
Admission: RE | Admit: 2020-05-22 | Discharge: 2020-05-22 | Disposition: A | Discharge status: Home / Self Care | Payer: BC Managed Care – PPO | Source: Ambulatory Visit | Attending: Family Medicine | Admitting: Family Medicine

## 2020-05-22 ENCOUNTER — Other Ambulatory Visit: Payer: Self-pay

## 2020-05-22 DIAGNOSIS — M25551 Pain in right hip: Secondary | ICD-10-CM | POA: Diagnosis not present

## 2020-05-22 DIAGNOSIS — M1611 Unilateral primary osteoarthritis, right hip: Secondary | ICD-10-CM

## 2020-05-27 ENCOUNTER — Other Ambulatory Visit: Payer: BC Managed Care – PPO

## 2020-05-28 DIAGNOSIS — M1611 Unilateral primary osteoarthritis, right hip: Secondary | ICD-10-CM | POA: Diagnosis not present

## 2020-05-29 ENCOUNTER — Other Ambulatory Visit: Payer: Self-pay

## 2020-05-29 DIAGNOSIS — D259 Leiomyoma of uterus, unspecified: Secondary | ICD-10-CM | POA: Diagnosis not present

## 2020-05-30 ENCOUNTER — Ambulatory Visit (INDEPENDENT_AMBULATORY_CARE_PROVIDER_SITE_OTHER): Payer: BC Managed Care – PPO | Admitting: Family Medicine

## 2020-05-30 ENCOUNTER — Encounter: Payer: Self-pay | Admitting: Family Medicine

## 2020-05-30 ENCOUNTER — Ambulatory Visit (INDEPENDENT_AMBULATORY_CARE_PROVIDER_SITE_OTHER): Payer: BC Managed Care – PPO

## 2020-05-30 VITALS — BP 120/68 | HR 71 | Temp 97.4°F | Ht 62.0 in | Wt 119.2 lb

## 2020-05-30 DIAGNOSIS — Z0000 Encounter for general adult medical examination without abnormal findings: Secondary | ICD-10-CM | POA: Diagnosis not present

## 2020-05-30 DIAGNOSIS — H6123 Impacted cerumen, bilateral: Secondary | ICD-10-CM | POA: Diagnosis not present

## 2020-05-30 DIAGNOSIS — Z01818 Encounter for other preprocedural examination: Secondary | ICD-10-CM | POA: Diagnosis not present

## 2020-05-30 MED ORDER — DEBROX 6.5 % OT SOLN: 5.0000 [drp] | Freq: Two times a day (BID) | OTIC | 4 refills | Status: AC

## 2020-05-30 NOTE — Progress Notes (Addendum)
New Patient Office Visit  Subjective:  Patient ID: Mary [name], female    DOB: 1956-01-05  Age: 65 y.o. MRN: [id number]  CC:  Chief Complaint  Patient presents with  . Establish Care    NP/establish care upcoming hip surgery needing labs before surgery. Not fasting.     HPI Mary [name] presents for establishment of care and a preop evaluation for total hip placement scheduled for June 6.  Patient enjoys good health.  She has no chronic medical illnesses.  She has no history of CVA even though the medical record indicates that.  Status post recent colonoscopy.  She will be scheduled for Pap at the end of next month.  There is no injury history to the hip that is to be replaced, her right.  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  Quit smoking 34 years ago.  She averages 1 alcoholic drink daily.  Past Medical History:  Diagnosis Date  . Arthritis     Past Surgical History:  Procedure Laterality Date  . COLONOSCOPY    . HIP FRACTURE SURGERY Left     Family History  Problem Relation Age of Onset  . Colon cancer Mother   . Breast cancer Mother        early 57's  . Esophageal cancer Neg Hx   . Rectal cancer Neg Hx   . Stomach cancer Neg Hx     Social History   Socioeconomic History  . Marital status: Divorced    Spouse name: Not on file  . Number of children: Not on file  . Years of education: Not on file  . Highest education level: Not on file  Occupational History  . Not on file  Tobacco Use  . Smoking status: Former Smoker    Quit date: 01/20/1986    Years since quitting: 34.3  . Smokeless tobacco: Never Used  Vaping Use  . Vaping Use: Never used  Substance and Sexual Activity  . Alcohol use: Yes    Alcohol/week: 7.0 standard drinks    Types: 7 Glasses of wine per week  . Drug use: No  . Sexual activity: Not on file  Other Topics Concern  . Not on file  Social History Narrative  . Not on file   Social  Determinants of Health   Financial Resource Strain: Not on file  Food Insecurity: Not on file  Transportation Needs: Not on file  Physical Activity: Not on file  Stress: Not on file  Social Connections: Not on file  Intimate Partner Violence: Not on file    ROS Review of Systems  Constitutional: Negative.   HENT: Negative.   Eyes: Negative.   Respiratory: Negative.   Cardiovascular: Negative.   Gastrointestinal: Negative.   Endocrine: Negative for polyphagia and polyuria.  Genitourinary: Negative.   Musculoskeletal: Positive for arthralgias.  Allergic/Immunologic: Negative for immunocompromised state.  Neurological: Negative.   Hematological: Does not bruise/bleed easily.  Psychiatric/Behavioral: Negative.    Depression screen [facility] 2/9 05/30/2020  Decreased Interest 0  Down, Depressed, Hopeless 0  PHQ - 2 Score 0    Objective:   Today's Vitals: BP 120/68   Pulse 71   Temp (!) 97.4 F (36.3 C) (Temporal)   Ht 5\' 2"  (1.575 m)   Wt 119 lb 3.2 oz (54.1 kg)   SpO2 99%   BMI 21.80 kg/m   Physical Exam Vitals and nursing note reviewed.  Constitutional:  General: She is not in acute distress.    Appearance: Normal appearance. She is normal weight. She is not ill-appearing, toxic-appearing or diaphoretic.  HENT:     Head: Normocephalic and atraumatic.     Right Ear: External ear normal. There is no impacted cerumen.     Left Ear: External ear normal. There is no impacted cerumen.     Mouth/Throat:     Mouth: Mucous membranes are moist.     Pharynx: Oropharynx is clear. No oropharyngeal exudate or posterior oropharyngeal erythema.  Eyes:     General: No scleral icterus.       Right eye: No discharge.        Left eye: No discharge.     Extraocular Movements: Extraocular movements intact.     Conjunctiva/sclera: Conjunctivae normal.     Pupils: Pupils are equal, round, and reactive to light.  Neck:     Vascular: No carotid bruit.  Cardiovascular:     Rate and  Rhythm: Normal rate and regular rhythm.  Pulmonary:     Effort: Pulmonary effort is normal.     Breath sounds: Normal breath sounds.  Abdominal:     General: Bowel sounds are normal.  Musculoskeletal:     Cervical back: No rigidity or tenderness.     Right lower leg: No edema.     Left lower leg: No edema.  Lymphadenopathy:     Cervical: No cervical adenopathy.  Skin:    General: Skin is warm and dry.  Neurological:     Mental Status: She is alert and oriented to person, place, and time.  Psychiatric:        Mood and Affect: Mood normal.        Behavior: Behavior normal.     Assessment & Plan:   Problem List Items Addressed This Visit   None   Visit Diagnoses    Pre-op evaluation    -  Primary   Relevant Orders   CBC   Comprehensive metabolic panel   Lipid panel   Urinalysis, Routine w reflex microscopic   DG Chest 2 View   Healthcare maintenance       Relevant Orders   CBC   Comprehensive metabolic panel   Lipid panel   Urinalysis, Routine w reflex microscopic   Excessive cerumen in both ear canals       Relevant Medications   carbamide peroxide (DEBROX) 6.5 % OTIC solution      Outpatient Encounter Medications as of 05/30/2020  Medication Sig  . Black Cohosh-SoyIsoflav-Magnol (ESTROVEN MENOPAUSE RELIEF PO) Take by mouth.  . carbamide peroxide (DEBROX) 6.5 % OTIC solution Place 5 drops into both ears 2 (two) times daily.  . diphenhydrAMINE (BENADRYL) 25 MG tablet Take 25 mg by mouth every 6 (six) hours as needed.  . gabapentin (NEURONTIN) 100 MG capsule Take 2 capsules (200 mg total) by mouth at bedtime.  Marland Kitchen ibuprofen (ADVIL,MOTRIN) 200 MG tablet Take 200 mg by mouth every 6 (six) hours as needed.  . Nutritional Supplements (ESTROVEN PO) Take by mouth daily.  Marland Kitchen PREDNISONE PO Take by mouth. PRN inflammation   Facility-Administered Encounter Medications as of 05/30/2020  Medication  . 0.9 %  sodium chloride infusion    Follow-up: Return in about 1 year  (around 05/30/2021), or if symptoms worsen or fail to improve.  Information given on health maintenance and disease prevention.  Given information on what to expect with hip surgery as well as dealing with ceruminosis.  Mortimer Fries  Ethelene Hal, MD

## 2020-05-30 NOTE — Addendum Note (Signed)
Addended by: Jon Billings on: 05/30/2020 12:48 PM   Modules accepted: Orders

## 2020-05-30 NOTE — Patient Instructions (Addendum)
Health Maintenance, Female Adopting a healthy lifestyle and getting preventive care are important in promoting health and wellness. Ask your health care provider about:  The right schedule for you to have regular tests and exams.  Things you can do on your own to prevent diseases and keep yourself healthy. What should I know about diet, weight, and exercise? Eat a healthy diet  Eat a diet that includes plenty of vegetables, fruits, low-fat dairy products, and lean protein.  Do not eat a lot of foods that are high in solid fats, added sugars, or sodium.   Maintain a healthy weight Body mass index (BMI) is used to identify weight problems. It estimates body fat based on height and weight. Your health care provider can help determine your BMI and help you achieve or maintain a healthy weight. Get regular exercise Get regular exercise. This is one of the most important things you can do for your health. Most adults should:  Exercise for at least 150 minutes each week. The exercise should increase your heart rate and make you sweat (moderate-intensity exercise).  Do strengthening exercises at least twice a week. This is in addition to the moderate-intensity exercise.  Spend less time sitting. Even light physical activity can be beneficial. Watch cholesterol and blood lipids Have your blood tested for lipids and cholesterol at 64 years of age, then have this test every 5 years. Have your cholesterol levels checked more often if:  Your lipid or cholesterol levels are high.  You are older than 64 years of age.  You are at high risk for heart disease. What should I know about cancer screening? Depending on your health history and family history, you may need to have cancer screening at various ages. This may include screening for:  Breast cancer.  Cervical cancer.  Colorectal cancer.  Skin cancer.  Lung cancer. What should I know about heart disease, diabetes, and high blood  pressure? Blood pressure and heart disease  High blood pressure causes heart disease and increases the risk of stroke. This is more likely to develop in people who have high blood pressure readings, are of African descent, or are overweight.  Have your blood pressure checked: ? Every 3-5 years if you are 59-59 years of age. ? Every year if you are 63 years old or older. Diabetes Have regular diabetes screenings. This checks your fasting blood sugar level. Have the screening done:  Once every three years after age 58 if you are at a normal weight and have a low risk for diabetes.  More often and at a younger age if you are overweight or have a high risk for diabetes. What should I know about preventing infection? Hepatitis B If you have a higher risk for hepatitis B, you should be screened for this virus. Talk with your health care provider to find out if you are at risk for hepatitis B infection. Hepatitis C Testing is recommended for:  Everyone born from [birth year] through 1964.  Anyone with known risk factors for hepatitis C. Sexually transmitted infections (STIs)  Get screened for STIs, including gonorrhea and chlamydia, if: ? You are sexually active and are younger than 64 years of age. ? You are older than 64 years of age and your health care provider tells you that you are at risk for this type of infection. ? Your sexual activity has changed since you were last screened, and you are at increased risk for chlamydia or gonorrhea. Ask your health care provider  if you are at risk.  Ask your health care provider about whether you are at high risk for HIV. Your health care provider may recommend a prescription medicine to help prevent HIV infection. If you choose to take medicine to prevent HIV, you should first get tested for HIV. You should then be tested every 3 months for as long as you are taking the medicine. Pregnancy  If you are about to stop having your period (premenopausal) and  you may become pregnant, seek counseling before you get pregnant.  Take 400 to 800 micrograms (mcg) of folic acid every day if you become pregnant.  Ask for birth control (contraception) if you want to prevent pregnancy. Osteoporosis and menopause Osteoporosis is a disease in which the bones lose minerals and strength with aging. This can result in bone fractures. If you are 62 years old or older, or if you are at risk for osteoporosis and fractures, ask your health care provider if you should:  Be screened for bone loss.  Take a calcium or vitamin D supplement to lower your risk of fractures.  Be given hormone replacement therapy (HRT) to treat symptoms of menopause. Follow these instructions at home: Lifestyle  Do not use any products that contain nicotine or tobacco, such as cigarettes, e-cigarettes, and chewing tobacco. If you need help quitting, ask your health care provider.  Do not use street drugs.  Do not share needles.  Ask your health care provider for help if you need support or information about quitting drugs. Alcohol use  Do not drink alcohol if: ? Your health care provider tells you not to drink. ? You are pregnant, may be pregnant, or are planning to become pregnant.  If you drink alcohol: ? Limit how much you use to 0-1 drink a day. ? Limit intake if you are breastfeeding.  Be aware of how much alcohol is in your drink. In the [place] one drink equals one 12 oz bottle of beer (355 mL), one 5 oz glass of wine (148 mL), or one 1 oz glass of hard liquor (44 mL). General instructions  Schedule regular health, dental, and eye exams.  Stay current with your vaccines.  Tell your health care provider if: ? You often feel depressed. ? You have ever been abused or do not feel safe at home. Summary  Adopting a healthy lifestyle and getting preventive care are important in promoting health and wellness.  Follow your health care provider's instructions about healthy  diet, exercising, and getting tested or screened for diseases.  Follow your health care provider's instructions on monitoring your cholesterol and blood pressure. This information is not intended to replace advice given to you by your health care provider. Make sure you discuss any questions you have with your health care provider. Document Revised: 12/30/2017 Document Reviewed: 12/30/2017 Elsevier Patient Education  2021 Elsevier Inc.  Preventive Care 39-27 Years Old, Female Preventive care refers to lifestyle choices and visits with your health care provider that can promote health and wellness. This includes:  A yearly physical exam. This is also called an annual wellness visit.  Regular dental and eye exams.  Immunizations.  Screening for certain conditions.  Healthy lifestyle choices, such as: ? Eating a healthy diet. ? Getting regular exercise. ? Not using drugs or products that contain nicotine and tobacco. ? Limiting alcohol use. What can I expect for my preventive care visit? Physical exam Your health care provider will check your:  Height and weight. These may  be used to calculate your BMI (body mass index). BMI is a measurement that tells if you are at a healthy weight.  Heart rate and blood pressure.  Body temperature.  Skin for abnormal spots. Counseling Your health care provider may ask you questions about your:  Past medical problems.  Family's medical history.  Alcohol, tobacco, and drug use.  Emotional well-being.  Home life and relationship well-being.  Sexual activity.  Diet, exercise, and sleep habits.  Work and work Statistician.  Access to firearms.  Method of birth control.  Menstrual cycle.  Pregnancy history. What immunizations do I need? Vaccines are usually given at various ages, according to a schedule. Your health care provider will recommend vaccines for you based on your age, medical history, and lifestyle or other factors,  such as travel or where you work.   What tests do I need? Blood tests  Lipid and cholesterol levels. These may be checked every 5 years, or more often if you are over 65 years old.  Hepatitis C test.  Hepatitis B test. Screening  Lung cancer screening. You may have this screening every year starting at age 90 if you have a 30-pack-year history of smoking and currently smoke or have quit within the past 15 years.  Colorectal cancer screening. ? All adults should have this screening starting at age 16 and continuing until age 38. ? Your health care provider may recommend screening at age 30 if you are at increased risk. ? You will have tests every 1-10 years, depending on your results and the type of screening test.  Diabetes screening. ? This is done by checking your blood sugar (glucose) after you have not eaten for a while (fasting). ? You may have this done every 1-3 years.  Mammogram. ? This may be done every 1-2 years. ? Talk with your health care provider about when you should start having regular mammograms. This may depend on whether you have a family history of breast cancer.  BRCA-related cancer screening. This may be done if you have a family history of breast, ovarian, tubal, or peritoneal cancers.  Pelvic exam and Pap test. ? This may be done every 3 years starting at age 58. ? Starting at age 60, this may be done every 5 years if you have a Pap test in combination with an HPV test. Other tests  STD (sexually transmitted disease) testing, if you are at risk.  Bone density scan. This is done to screen for osteoporosis. You may have this scan if you are at high risk for osteoporosis. Talk with your health care provider about your test results, treatment options, and if necessary, the need for more tests. Follow these instructions at home: Eating and drinking  Eat a diet that includes fresh fruits and vegetables, whole grains, lean protein, and low-fat dairy  products.  Take vitamin and mineral supplements as recommended by your health care provider.  Do not drink alcohol if: ? Your health care provider tells you not to drink. ? You are pregnant, may be pregnant, or are planning to become pregnant.  If you drink alcohol: ? Limit how much you have to 0-1 drink a day. ? Be aware of how much alcohol is in your drink. In the [place] one drink equals one 12 oz bottle of beer (355 mL), one 5 oz glass of wine (148 mL), or one 1 oz glass of hard liquor (44 mL).   Lifestyle  Take daily care of your teeth and  gums. Brush your teeth every morning and night with fluoride toothpaste. Floss one time each day.  Stay active. Exercise for at least 30 minutes 5 or more days each week.  Do not use any products that contain nicotine or tobacco, such as cigarettes, e-cigarettes, and chewing tobacco. If you need help quitting, ask your health care provider.  Do not use drugs.  If you are sexually active, practice safe sex. Use a condom or other form of protection to prevent STIs (sexually transmitted infections).  If you do not wish to become pregnant, use a form of birth control. If you plan to become pregnant, see your health care provider for a prepregnancy visit.  If told by your health care provider, take low-dose aspirin daily starting at age 21.  Find healthy ways to cope with stress, such as: ? Meditation, yoga, or listening to music. ? Journaling. ? Talking to a trusted person. ? Spending time with friends and family. Safety  Always wear your seat belt while driving or riding in a vehicle.  Do not drive: ? If you have been drinking alcohol. Do not ride with someone who has been drinking. ? When you are tired or distracted. ? While texting.  Wear a helmet and other protective equipment during sports activities.  If you have firearms in your house, make sure you follow all gun safety procedures. What's next?  Visit your health care provider  once a year for an annual wellness visit.  Ask your health care provider how often you should have your eyes and teeth checked.  Stay up to date on all vaccines. This information is not intended to replace advice given to you by your health care provider. Make sure you discuss any questions you have with your health care provider. Document Revised: 10/11/2019 Document Reviewed: 09/17/2017 Elsevier Patient Education  2021 [place].  Preparing for Hip Replacement Preparing before hip replacement surgery can make recovery easier and more comfortable. The information below gives some tips and guidelines that will help to prepare for this surgery. Talk with your health care provider so you can learn what to expect before, during, and after surgery. Ask questions if you do not understand something. Tell a health care provider about:  Any allergies you have.  All medicines you are taking, including vitamins, herbs, eye drops, creams, and over-the-counter medicines.  Any problems you or family members have had with anesthetic medicines.  Any blood disorders you have.  Any surgeries you have had.  Any medical conditions you have.  Whether you are pregnant or may be pregnant. What happens before the procedure? Health care provider visits You will need to have a physical exam before you are scheduled for surgery (preoperative exam) to make sure it is safe for you to have surgery. This may require you to have more tests. When you go to the preoperative exam, bring a list of all the medicines, vitamins, herbs, and supplements that you take. Have dental care and routine cleanings done before surgery. Germs from anywhere in your body, including your mouth, can travel to your new joint and infect it. Tell your dentist that you plan to have hip replacement surgery. Surgery costs To find out how much surgery will cost, call your insurance company as soon as you decide to have surgery. Ask  questions such as:  How much of the surgery and hospital stay will be covered?  What will be covered for the following items? ? Medical equipment. ? Rehabilitation facilities. ?  Home care. ? Medicines. Preparing your home  Pick a recovery spot that is not your bed. During recovery, it is better that you sit more upright than you can in your bed. Here are some tips for choosing a recovery spot: ? Choose a chair with arms and a high, firm seat that will not allow you to sink down into it. Chairs and sofas that are too soft can allow your hip to bend at an angle greater than 90 degrees. This could put you at risk for moving your new hip joint out of place (dislocating it). ? Place items that you often use on a small table next to your chair. These items may include the TV remote control, a mobile phone, a book, a laptop computer, and a water glass.  You may be given a walker to use at home. Check if you will have enough room to use a walker. Move around your home with your hands out about 6 inches (15 cm) from your sides. You will have enough room if you do not hit anything with your hands as you do this. Walk from: ? Your recovery spot to your kitchen and bathroom. ? Your bed to the bathroom.  Apply these general tips: ? Move the items you use most often to shelves and drawers at countertop height. Do this in your kitchen, bathroom, and bedroom. ? Prepare some meals to freeze and reheat later. Home safety  Remove all clutter and throw rugs from your floors. Doing this will help you avoid tripping.  Consider getting safety equipment that will be helpful during your recovery, such as: ? Grab bars in the shower and near the toilet. ? A raised toilet seat. This will help you get on and off the toilet more easily. ? A tub or shower bench.      Preparing your body  If you smoke, quit as soon as you can before surgery. If there is time, it is best to quit several months before surgery. Tell  your surgeon if you use any products that contain nicotine or tobacco, such as cigarettes, e-cigarettes, and chewing tobacco. These can delay healing. If you need help quitting, ask your health care provider.  Maintain a healthy diet. Do not change your diet before surgery unless your health care provider tells you to do that.  Do not drink any alcohol for at least 48 hours before surgery.  Talk to your health care provider about doing exercises before your surgery. ? Be sure to follow the exercise program given by your health care provider. ? Doing these exercises in the weeks before your surgery may help reduce pain and improve function after surgery. Recovery planning In the first couple of weeks after surgery, it will be harder for you to do some of your regular activities. You may get tired easily, and you may have limited movement in your leg. To make sure you have all the help you need after your surgery:  Plan to have a responsible adult take you home from the hospital. Your health care provider will tell you how many days you can expect to be in the hospital.  Cancel all of your work, caregiving, and volunteer responsibilities for at least 4-6 weeks after surgery.  Plan to have a responsible adult stay with you both day and night for the first week. This person should be someone you are comfortable with. You may need this person to help you with your exercises and personal care, such  as bathing and using the toilet.  If you live alone, arrange for someone to take care of your home and pets for the first 4-6 weeks after surgery.  You may not be able to drive for 4-6 weeks. Plan to have someone drive you on errands and to appointments.  Consider applying for a disability parking permit. To get an application, call your local Department of Motor Vehicles [facility]) or your health care provider's office. Summary  Getting prepared before hip replacement surgery can make your recovery easier  and more comfortable.  Keep all of your preoperative appointments to make sure that you are ready for your surgery.  Prepare your home and arrange for help at home.  Plan to have a responsible adult take you home from the hospital and stay with you day and night for the first week. This information is not intended to replace advice given to you by your health care provider. Make sure you discuss any questions you have with your health care provider. Document Revised: 06/02/2019 Document Reviewed: 06/02/2019 Elsevier Patient Education  2021 [place], Adult The ears produce a substance called earwax that helps keep bacteria out of the ear and protects the skin in the ear canal. Occasionally, earwax can build up in the ear and cause discomfort or hearing loss. What are the causes? This condition is caused by a buildup of earwax. Ear canals are self-cleaning. Ear wax is made in the outer part of the ear canal and generally falls out in small amounts over time. When the self-cleaning mechanism is not working, earwax builds up and can cause decreased hearing and discomfort. Attempting to clean ears with cotton swabs can push the earwax deep into the ear canal and cause decreased hearing and pain. What increases the risk? This condition is more likely to develop in people who:  Clean their ears often with cotton swabs.  Pick at their ears.  Use earplugs or in-ear headphones often, or wear hearing aids. The following factors may also make you more likely to develop this condition:  Being female.  Being of older age.  Naturally producing more earwax.  Having narrow ear canals.  Having earwax that is overly thick or sticky.  Having excess hair in the ear canal.  Having eczema.  Being dehydrated. What are the signs or symptoms? Symptoms of this condition include:  Reduced or muffled hearing.  A feeling of fullness in the ear or feeling that the ear is  plugged.  Fluid coming from the ear.  Ear pain or an itchy ear.  Ringing in the ear.  Coughing.  Balance problems.  An obvious piece of earwax that can be seen inside the ear canal. How is this diagnosed? This condition may be diagnosed based on:  Your symptoms.  Your medical history.  An ear exam. During the exam, your health care provider will look into your ear with an instrument called an otoscope. You may have tests, including a hearing test. How is this treated? This condition may be treated by:  Using ear drops to soften the earwax.  Having the earwax removed by a health care provider. The health care provider may: ? Flush the ear with water. ? Use an instrument that has a loop on the end (curette). ? Use a suction device.  Having surgery to remove the wax buildup. This may be done in severe cases. Follow these instructions at home:  Take over-the-counter and prescription medicines only as told by  your health care provider.  Do not put any objects, including cotton swabs, into your ear. You can clean the opening of your ear canal with a washcloth or facial tissue.  Follow instructions from your health care provider about cleaning your ears. Do not overclean your ears.  Drink enough fluid to keep your urine pale yellow. This will help to thin the earwax.  Keep all follow-up visits as told. If earwax builds up in your ears often or if you use hearing aids, consider seeing your health care provider for routine, preventive ear cleanings. Ask your health care provider how often you should schedule your cleanings.  If you have hearing aids, clean them according to instructions from the manufacturer and your health care provider.   Contact a health care provider if:  You have ear pain.  You develop a fever.  You have pus or other fluid coming from your ear.  You have hearing loss.  You have ringing in your ears that does not go away.  You feel like the room  is spinning (vertigo).  Your symptoms do not improve with treatment. Get help right away if:  You have bleeding from the affected ear.  You have severe ear pain. Summary  Earwax can build up in the ear and cause discomfort or hearing loss.  The most common symptoms of this condition include reduced or muffled hearing, a feeling of fullness in the ear, or feeling that the ear is plugged.  This condition may be diagnosed based on your symptoms, your medical history, and an ear exam.  This condition may be treated by using ear drops to soften the earwax or by having the earwax removed by a health care provider.  Do not put any objects, including cotton swabs, into your ear. You can clean the opening of your ear canal with a washcloth or facial tissue. This information is not intended to replace advice given to you by your health care provider. Make sure you discuss any questions you have with your health care provider. Document Revised: 04/26/2019 Document Reviewed: 04/26/2019 Elsevier Patient Education  [place].

## 2020-06-01 ENCOUNTER — Other Ambulatory Visit (INDEPENDENT_AMBULATORY_CARE_PROVIDER_SITE_OTHER): Payer: BC Managed Care – PPO

## 2020-06-01 ENCOUNTER — Other Ambulatory Visit: Payer: Self-pay

## 2020-06-01 DIAGNOSIS — Z0000 Encounter for general adult medical examination without abnormal findings: Secondary | ICD-10-CM | POA: Diagnosis not present

## 2020-06-01 DIAGNOSIS — Z01818 Encounter for other preprocedural examination: Secondary | ICD-10-CM | POA: Diagnosis not present

## 2020-06-01 LAB — CBC
HCT: 41 % (ref 36.0–46.0)
Hemoglobin: 13.6 g/dL (ref 12.0–15.0)
MCHC: 33.2 g/dL (ref 30.0–36.0)
MCV: 99.3 fl (ref 78.0–100.0)
Platelets: 231 10*3/uL (ref 150.0–400.0)
RBC: 4.13 Mil/uL (ref 3.87–5.11)
RDW: 13.3 % (ref 11.5–15.5)
WBC: 10.7 10*3/uL — ABNORMAL HIGH (ref 4.0–10.5)

## 2020-06-01 LAB — COMPREHENSIVE METABOLIC PANEL
ALT: 14 U/L (ref 0–35)
AST: 16 U/L (ref 0–37)
Albumin: 4.4 g/dL (ref 3.5–5.2)
Alkaline Phosphatase: 39 U/L (ref 39–117)
BUN: 14 mg/dL (ref 6–23)
CO2: 29 mEq/L (ref 19–32)
Calcium: 9.4 mg/dL (ref 8.4–10.5)
Chloride: 100 mEq/L (ref 96–112)
Creatinine, Ser: 0.8 mg/dL (ref 0.40–1.20)
GFR: 78.17 mL/min (ref >60.00)
Glucose, Bld: 97 mg/dL (ref 70–99)
Potassium: 3.8 mEq/L (ref 3.5–5.1)
Sodium: 139 mEq/L (ref 135–145)
Total Bilirubin: 0.6 mg/dL (ref 0.2–1.2)
Total Protein: 6.8 g/dL (ref 6.0–8.3)

## 2020-06-01 LAB — URINALYSIS, ROUTINE W REFLEX MICROSCOPIC
Hgb urine dipstick: NEGATIVE
Leukocytes,Ua: NEGATIVE
Nitrite: NEGATIVE
RBC / HPF: NONE SEEN (ref 0–?)
Specific Gravity, Urine: 1.02 (ref 1.000–1.030)
Total Protein, Urine: NEGATIVE
Urine Glucose: NEGATIVE
Urobilinogen, UA: 0.2 (ref 0.0–1.0)
WBC, UA: NONE SEEN (ref 0–?)
pH: 5.5 (ref 5.0–8.0)

## 2020-06-01 LAB — LIPID PANEL
Cholesterol: 243 mg/dL — ABNORMAL HIGH (ref 0–200)
HDL: 102.8 mg/dL (ref >39.00)
LDL Cholesterol: 118 mg/dL — ABNORMAL HIGH (ref 0–99)
NonHDL: 140.12
Total CHOL/HDL Ratio: 2
Triglycerides: 112 mg/dL (ref 0.0–149.0)
VLDL: 22.4 mg/dL (ref 0.0–40.0)

## 2020-06-01 NOTE — Progress Notes (Signed)
Per orders of Dr.Kremer pt is here for lab draw and tolerated draw well.

## 2020-06-04 ENCOUNTER — Telehealth: Payer: Self-pay | Admitting: Family Medicine

## 2020-06-04 DIAGNOSIS — Z0000 Encounter for general adult medical examination without abnormal findings: Secondary | ICD-10-CM

## 2020-06-04 NOTE — Telephone Encounter (Signed)
Pt is wanting a bone density order placed for her. Please advise pt at [phone].

## 2020-06-05 NOTE — Telephone Encounter (Signed)
Patient aware that bone density ordered

## 2020-06-05 NOTE — Telephone Encounter (Signed)
Spoke with patient who states that she feels like a bone density would be good to check. She's also with a fractured hip and would like a referral/order for testing. Please advise. Last OV 05/30/20

## 2020-06-13 ENCOUNTER — Telehealth: Payer: Self-pay | Admitting: Family Medicine

## 2020-06-13 NOTE — Telephone Encounter (Signed)
[facility] is calling for [facility] needing a surgical clearance form and a lab of PTINR for the pt. Fax# [phone]. If any further questions please call Wells Guiles at [phone]

## 2020-06-14 NOTE — Telephone Encounter (Signed)
Form filled out labs printed all faxed to below number conformation received.

## 2020-06-19 ENCOUNTER — Other Ambulatory Visit: Payer: Self-pay

## 2020-06-19 ENCOUNTER — Ambulatory Visit (HOSPITAL_BASED_OUTPATIENT_CLINIC_OR_DEPARTMENT_OTHER)
Admission: RE | Admit: 2020-06-19 | Discharge: 2020-06-19 | Disposition: A | Discharge status: Home / Self Care | Payer: BC Managed Care – PPO | Source: Ambulatory Visit | Attending: Family Medicine | Admitting: Family Medicine

## 2020-06-19 DIAGNOSIS — Z780 Asymptomatic menopausal state: Secondary | ICD-10-CM | POA: Diagnosis not present

## 2020-06-19 DIAGNOSIS — Z0000 Encounter for general adult medical examination without abnormal findings: Secondary | ICD-10-CM

## 2020-06-19 DIAGNOSIS — M85851 Other specified disorders of bone density and structure, right thigh: Secondary | ICD-10-CM | POA: Diagnosis not present

## 2020-06-20 ENCOUNTER — Telehealth: Payer: Self-pay | Admitting: Family Medicine

## 2020-06-20 DIAGNOSIS — M1611 Unilateral primary osteoarthritis, right hip: Secondary | ICD-10-CM | POA: Diagnosis not present

## 2020-06-20 NOTE — Telephone Encounter (Signed)
Pt would a call concerning her Bone Density scan. She would like someone to go over it with her. Please advise pt at [phone].

## 2020-06-20 NOTE — Telephone Encounter (Signed)
Spoke with patient who verbally understood bone density results and recommendations.

## 2020-06-21 DIAGNOSIS — M6281 Muscle weakness (generalized): Secondary | ICD-10-CM | POA: Diagnosis not present

## 2020-06-21 DIAGNOSIS — M1611 Unilateral primary osteoarthritis, right hip: Secondary | ICD-10-CM | POA: Diagnosis not present

## 2020-06-27 ENCOUNTER — Ambulatory Visit: Payer: BC Managed Care – PPO | Admitting: Family Medicine

## 2020-06-28 DIAGNOSIS — M1611 Unilateral primary osteoarthritis, right hip: Secondary | ICD-10-CM | POA: Diagnosis not present

## 2020-07-24 DIAGNOSIS — R309 Painful micturition, unspecified: Secondary | ICD-10-CM | POA: Diagnosis not present

## 2020-07-24 DIAGNOSIS — Z01411 Encounter for gynecological examination (general) (routine) with abnormal findings: Secondary | ICD-10-CM | POA: Diagnosis not present

## 2020-07-24 DIAGNOSIS — D259 Leiomyoma of uterus, unspecified: Secondary | ICD-10-CM | POA: Diagnosis not present

## 2020-07-24 DIAGNOSIS — Z124 Encounter for screening for malignant neoplasm of cervix: Secondary | ICD-10-CM | POA: Diagnosis not present

## 2020-07-24 DIAGNOSIS — Z01419 Encounter for gynecological examination (general) (routine) without abnormal findings: Secondary | ICD-10-CM | POA: Diagnosis not present

## 2020-07-24 DIAGNOSIS — Z6821 Body mass index (BMI) 21.0-21.9, adult: Secondary | ICD-10-CM | POA: Diagnosis not present

## 2020-08-28 NOTE — Progress Notes (Signed)
[place] Phone: [phone] Subjective:   [name], am serving as a scribe for Dr. Hulan Saas.  This visit occurred during the SARS-CoV-2 public health emergency.  Safety protocols were in place, including screening questions prior to the visit, additional usage of staff PPE, and extensive cleaning of exam room while observing appropriate contact time as indicated for disinfecting solutions.   I'm seeing this patient by the request  of:  Libby Maw, MD  CC: hip and back pain   [id number]  05/16/2020 Significant arthritis of the right hip.  Discussed with patient in great length about icing regimen and home exercises.  Patient has had fracture on the contralateral side with moderate arthritis as well.  Patient does have some weakness of the leg though that seems to be potentially secondary to more of the back but deep tendon reflexes are likely intact.  Patient will be referred to orthopedic surgery to discuss potential surgical intervention.  Patient is having some lumbar radiculopathy.  Noticing the patient does have weakness of the right hip girdle.  Unfortunately found to have significant amount of arthritic changes of the right hip that is likely contributing to some of the abnormal gait.  Patient on x-rays of the hip also show that patient has a severely calcified uterus that is likely contributing to abnormality noted on x-ray that was reviewed today.  Encouraged her to follow-up with her gynecologist.  Patient denies any fevers, chills, any abnormal weight loss.  Patient did bring MRI CD and today that unfortunately we were unable to get pictures pulled up.  X-rays of the hips noted does show that patient does have significant degenerative disc disease of the lumbar spine that likely does contribute to more of a nerve impingement.  Patient will be started on gabapentin and once we do have a final  read or pictures we can see we will consider the possibility of epidurals.  Patient then will continue with some home exercises at this point.  Follow-up with me again in 4 to 6 weeks.  Update 08/28/2020 IRYS MEINER is a 65 y.o. female coming in with complaint of R hip and LBP. R hip replacement on June 9th. Pain now in L hip in groin and quad and also having L sided lumbar spine pain. Patient started riding again and is trying to ride larger horse which caused increase in her pain on left side.    MRI of the right hip 5/22- advance OA of right hip reviewing this MRI can see the patient does have severe degenerative disc disease of the lower lumbar spine with likely foraminal narrowing of the left L4-L5.    Past Medical History:  Diagnosis Date   Arthritis    Past Surgical History:  Procedure Laterality Date   COLONOSCOPY     HIP FRACTURE SURGERY Left    Social History   Socioeconomic History   Marital status: Divorced    Spouse name: Not on file   Number of children: Not on file   Years of education: Not on file   Highest education level: Not on file  Occupational History   Not on file  Tobacco Use   Smoking status: Former    Types: Cigarettes    Quit date: 01/20/1986    Years since quitting: 34.6   Smokeless tobacco: Never  Vaping Use   Vaping Use: Never used  Substance and Sexual Activity  Alcohol use: Yes    Alcohol/week: 7.0 standard drinks    Types: 7 Glasses of wine per week   Drug use: No   Sexual activity: Not on file  Other Topics Concern   Not on file  Social History Narrative   Not on file   Social Determinants of Health   Financial Resource Strain: Not on file  Food Insecurity: Not on file  Transportation Needs: Not on file  Physical Activity: Not on file  Stress: Not on file  Social Connections: Not on file   No Known Allergies Family History  Problem Relation Age of Onset   Colon cancer Mother    Breast cancer Mother        early 24's    Esophageal cancer Neg Hx    Rectal cancer Neg Hx    Stomach cancer Neg Hx     Current Outpatient Medications (Endocrine & Metabolic):    PREDNISONE PO, Take by mouth. PRN inflammation     Current Outpatient Medications (Respiratory):    diphenhydrAMINE (BENADRYL) 25 MG tablet, Take 25 mg by mouth every 6 (six) hours as needed.   Current Outpatient Medications (Analgesics):    ibuprofen (ADVIL,MOTRIN) 200 MG tablet, Take 200 mg by mouth every 6 (six) hours as needed.     Current Outpatient Medications (Other):    carbamide peroxide (DEBROX) 6.5 % OTIC solution, Place 5 drops into both ears 2 (two) times daily.   Nutritional Supplements (ESTROVEN PO), Take by mouth daily.   gabapentin (NEURONTIN) 100 MG capsule, Take 2 capsules (200 mg total) by mouth at bedtime.  Current Facility-Administered Medications (Other):    0.9 %  sodium chloride infusion   Reviewed prior external information including notes and imaging from  primary care provider As well as notes that were available from care everywhere and other healthcare systems.  Past medical history, social, surgical and family history all reviewed in electronic medical record.  No pertanent information unless stated regarding to the chief complaint.   Review of Systems:  No headache, visual changes, nausea, vomiting, diarrhea, constipation, dizziness, abdominal pain, skin rash, fevers, chills, night sweats, weight loss, swollen lymph nodes, body aches, joint swelling, chest pain, shortness of breath, mood changes. POSITIVE muscle aches  Objective  Blood pressure 110/64, pulse 72, height '5\' 2"'$  (1.575 m), weight 119 lb (54 kg), SpO2 99 %.   General: No apparent distress alert and oriented x3 mood and affect normal, dressed appropriately.  HEENT: Pupils equal, extraocular movements intact  Respiratory: Patient's speak in full sentences and does not appear short of breath  Cardiovascular: No lower extremity edema, non tender,  no erythema  Gait antalgic MSK:   Right hip exam shows improvement in range of motion.  Left hip does have some mild decrease in internal range of motion of 5 degrees.  Patient will has a positive straight leg test.  Severe tenderness to palpation over the paraspinal musculature of the lumbar spine.  Patient has limited flexion and extension noted.  Mild weakness noted of the lower extremity.     Impression and Recommendations:     The above documentation has been reviewed and is accurate and complete Lyndal Pulley, DO

## 2020-08-29 ENCOUNTER — Other Ambulatory Visit: Payer: Self-pay

## 2020-08-29 ENCOUNTER — Ambulatory Visit (INDEPENDENT_AMBULATORY_CARE_PROVIDER_SITE_OTHER): Payer: BC Managed Care – PPO | Admitting: Family Medicine

## 2020-08-29 VITALS — BP 110/64 | HR 72 | Ht 62.0 in | Wt 119.0 lb

## 2020-08-29 DIAGNOSIS — M5416 Radiculopathy, lumbar region: Secondary | ICD-10-CM

## 2020-08-29 NOTE — Assessment & Plan Note (Signed)
Patient is having pain still now on the left hip girdle.  Mild on the right but nothing as severe as what she had previously.  Concerned that this may be more secondary to be lumbar radiculopathy.  We will try an epidural.  Patient has had difficulty with different medications and would like to avoid getting them on the long-term and hopefully this injection will be very helpful.  Discussed posture antibiotics.  Discussed home exercises.  Patient will have the epidural in and see me again 4 weeks afterwards to see how she is responding.  Differential includes the arthritis of the left hip but I think patient should do relatively well.

## 2020-08-29 NOTE — Patient Instructions (Signed)
Epidural L4/L5 [id number] See me 4 weeks after injection

## 2020-09-03 ENCOUNTER — Other Ambulatory Visit: Payer: Self-pay

## 2020-09-03 ENCOUNTER — Ambulatory Visit
Admission: RE | Admit: 2020-09-03 | Discharge: 2020-09-03 | Disposition: A | Discharge status: Home / Self Care | Payer: BC Managed Care – PPO | Source: Ambulatory Visit | Attending: Family Medicine | Admitting: Family Medicine

## 2020-09-03 DIAGNOSIS — M5416 Radiculopathy, lumbar region: Secondary | ICD-10-CM

## 2020-09-03 DIAGNOSIS — M545 Low back pain, unspecified: Secondary | ICD-10-CM | POA: Diagnosis not present

## 2020-09-03 DIAGNOSIS — M4316 Spondylolisthesis, lumbar region: Secondary | ICD-10-CM | POA: Diagnosis not present

## 2020-09-03 MED ORDER — IOPAMIDOL (ISOVUE-M 200) INJECTION 41%
1.0000 mL | Freq: Once | INTRAMUSCULAR | Status: AC
  Administered 2020-09-03: 1 mL via EPIDURAL

## 2020-09-03 MED ORDER — METHYLPREDNISOLONE ACETATE 40 MG/ML INJ SUSP (RADIOLOG
80.0000 mg | Freq: Once | INTRAMUSCULAR | Status: AC
  Administered 2020-09-03: 80 mg via EPIDURAL

## 2020-09-03 NOTE — Discharge Instructions (Signed)

## 2020-09-11 ENCOUNTER — Ambulatory Visit
Admission: RE | Admit: 2020-09-11 | Discharge: 2020-09-11 | Disposition: A | Discharge status: Home / Self Care | Payer: BC Managed Care – PPO | Source: Ambulatory Visit | Attending: Obstetrics and Gynecology | Admitting: Obstetrics and Gynecology

## 2020-09-11 ENCOUNTER — Other Ambulatory Visit: Payer: Self-pay

## 2020-09-11 DIAGNOSIS — R921 Mammographic calcification found on diagnostic imaging of breast: Secondary | ICD-10-CM

## 2020-10-03 ENCOUNTER — Ambulatory Visit (INDEPENDENT_AMBULATORY_CARE_PROVIDER_SITE_OTHER): Payer: BC Managed Care – PPO | Admitting: Family Medicine

## 2020-10-03 ENCOUNTER — Other Ambulatory Visit: Payer: Self-pay

## 2020-10-03 ENCOUNTER — Ambulatory Visit: Payer: Self-pay

## 2020-10-03 ENCOUNTER — Encounter: Payer: Self-pay | Admitting: Family Medicine

## 2020-10-03 VITALS — BP 110/74 | HR 74 | Ht 62.0 in | Wt 119.0 lb

## 2020-10-03 DIAGNOSIS — M25552 Pain in left hip: Secondary | ICD-10-CM | POA: Diagnosis not present

## 2020-10-03 DIAGNOSIS — M1612 Unilateral primary osteoarthritis, left hip: Secondary | ICD-10-CM

## 2020-10-03 NOTE — Progress Notes (Signed)
[place] [address] [place] Phone: [phone] Subjective:   IVilma Meckel, am serving as a scribe for Dr. Hulan Saas. This visit occurred during the SARS-CoV-2 public health emergency.  Safety protocols were in place, including screening questions prior to the visit, additional usage of staff PPE, and extensive cleaning of exam room while observing appropriate contact time as indicated for disinfecting solutions.   I'm seeing this patient by the request  of:  Libby Maw, MD  CC: back and hip pain   [id number]  Mary [name] is a 65 y.o. female coming in with complaint of low back pain and hip pain. Sent for epidural and had it 8/15.  Patient states the epidural helped only for about a week. Since then the pain has not gotten better, she feels like it is worse. She is getting shooting pains in her left hip and groin when riding a wider horse.    Hip xray ORIF left femoral neck  MRI of right hip is severe arthritis   Past Medical History:  Diagnosis Date   Arthritis    Past Surgical History:  Procedure Laterality Date   COLONOSCOPY     HIP FRACTURE SURGERY Left    Social History   Socioeconomic History   Marital status: Divorced    Spouse name: Not on file   Number of children: Not on file   Years of education: Not on file   Highest education level: Not on file  Occupational History   Not on file  Tobacco Use   Smoking status: Former    Types: Cigarettes    Quit date: 01/20/1986    Years since quitting: 34.7   Smokeless tobacco: Never  Vaping Use   Vaping Use: Never used  Substance and Sexual Activity   Alcohol use: Yes    Alcohol/week: 7.0 standard drinks    Types: 7 Glasses of wine per week   Drug use: No   Sexual activity: Not on file  Other Topics Concern   Not on file  Social History Narrative   Not on file   Social Determinants of Health   Financial Resource Strain: Not on file   Food Insecurity: Not on file  Transportation Needs: Not on file  Physical Activity: Not on file  Stress: Not on file  Social Connections: Not on file   No Known Allergies Family History  Problem Relation Age of Onset   Colon cancer Mother    Breast cancer Mother        early 59's   Esophageal cancer Neg Hx    Rectal cancer Neg Hx    Stomach cancer Neg Hx     Current Outpatient Medications (Endocrine & Metabolic):    PREDNISONE PO, Take by mouth. PRN inflammation     Current Outpatient Medications (Respiratory):    diphenhydrAMINE (BENADRYL) 25 MG tablet, Take 25 mg by mouth every 6 (six) hours as needed.   Current Outpatient Medications (Analgesics):    ibuprofen (ADVIL,MOTRIN) 200 MG tablet, Take 200 mg by mouth every 6 (six) hours as needed.     Current Outpatient Medications (Other):    carbamide peroxide (DEBROX) 6.5 % OTIC solution, Place 5 drops into both ears 2 (two) times daily.   gabapentin (NEURONTIN) 100 MG capsule, Take 2 capsules (200 mg total) by mouth at bedtime.   Nutritional Supplements (ESTROVEN PO), Take by mouth daily.  Current Facility-Administered Medications (Other):    0.9 %  sodium chloride infusion   Reviewed prior external information including notes and imaging from  primary care provider As well as notes that were available from care everywhere and other healthcare systems.  Past medical history, social, surgical and family history all reviewed in electronic medical record.  No pertanent information unless stated regarding to the chief complaint.   Review of Systems:  No headache, visual changes, nausea, vomiting, diarrhea, constipation, dizziness, abdominal pain, skin rash, fevers, chills, night sweats, weight loss, swollen lymph nodes, body aches, joint swelling, chest pain, shortness of breath, mood changes. POSITIVE muscle aches  Objective  Blood pressure 110/74, pulse 74, height '5\' 2"'$  (1.575 m), weight 119 lb (54 kg), SpO2 99  %.   General: No apparent distress alert and oriented x3 mood and affect normal, dressed appropriately.  HEENT: Pupils equal, extraocular movements intact  Respiratory: Patient's speak in full sentences and does not appear short of breath  Cardiovascular: No lower extremity edema, non tender, no erythema  Gait antalgic gait MSK:  back and hip patient's back exam does have some loss of lordosis. Left hip exam does have some limited range of motion with internal range of motion that does give her more groin pain and still on the lateral aspect of the right hip.  Some pain on the posterior aspect of the hip still noted as well.  Straight leg test is tight but no true radicular symptoms at the moment.  Mild worsening pain with extension of the back.  Procedure: Real-time Ultrasound Guided Injection of left intra-articular hip Device: GE Logiq Q7  Ultrasound guided injection is preferred based studies that show increased duration, increased effect, greater accuracy, decreased procedural pain, increased response rate with ultrasound guided versus blind injection.  Verbal informed consent obtained.  Time-out conducted.  Noted no overlying erythema, induration, or other signs of local infection.  Skin prepped in a sterile fashion.  Local anesthesia: Topical Ethyl chloride.  With sterile technique and under real time ultrasound guidance:  Anterior capsule visualized, needle visualized going to the head neck junction at the anterior capsule. Pictures taken. Patient did have injection of 2 cc of 0.5% Marcaine, and 1 cc of Kenalog 40 mg/dL. Completed without difficulty  Pain immediately resolved suggesting accurate placement of the medication.  Advised to call if fevers/chills, erythema, induration, drainage, or persistent bleeding.  Impression: Technically successful ultrasound guided injection.   Impression and Recommendations:     The above documentation has been reviewed and is accurate and  complete Lyndal Pulley, DO

## 2020-10-03 NOTE — Assessment & Plan Note (Addendum)
Patient given injection today and tolerated the procedure well.  Patient has had a previous ORIF of this hip.  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  Patient states that she understands it.  Patient did have unfortunately significant decrease in range of motion since last time we have seen patient.  Did not respond as much to the lumbar epidural and I do feel that this is more of the hip.  Patient wants to avoid any type of surgical intervention.  We were going to get 2 new x-rays of the left hip however our x-ray was closed for the day and patient was willing to come back at another time.  Otherwise patient will follow up with me again in 6 weeks to see how patient responds.  Once again did discuss with patient about the possibility of infection with patient having the ORIF and to seek medical attention if anything such as redness or swelling occurs

## 2020-10-03 NOTE — Patient Instructions (Signed)
Xray L hip

## 2020-10-04 ENCOUNTER — Ambulatory Visit (INDEPENDENT_AMBULATORY_CARE_PROVIDER_SITE_OTHER): Payer: BC Managed Care – PPO

## 2020-10-04 DIAGNOSIS — M1612 Unilateral primary osteoarthritis, left hip: Secondary | ICD-10-CM | POA: Diagnosis not present

## 2020-10-04 DIAGNOSIS — Z96641 Presence of right artificial hip joint: Secondary | ICD-10-CM | POA: Diagnosis not present

## 2020-10-04 DIAGNOSIS — M25552 Pain in left hip: Secondary | ICD-10-CM | POA: Diagnosis not present

## 2020-10-17 NOTE — Progress Notes (Signed)
[place] Phone: [phone] Subjective:   [name], am serving as a scribe for Dr. Hulan Saas.  This visit occurred during the SARS-CoV-2 public health emergency.  Safety protocols were in place, including screening questions prior to the visit, additional usage of staff PPE, and extensive cleaning of exam room while observing appropriate contact time as indicated for disinfecting solutions.   I'm seeing this patient by the request  of:  Libby Maw, MD  CC: Left hip pain follow-up  [id number]  10/03/2020 Patient given injection today and tolerated the procedure well.  Patient has had a previous ORIF of this hip.  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  Patient states that she understands it.  Patient did have unfortunately significant decrease in range of motion since last time we have seen patient.  Did not respond as much to the lumbar epidural and I do feel that this is more of the hip.  Patient wants to avoid any type of surgical intervention.  We were going to get 2 new x-rays of the left hip however our x-ray was closed for the day and patient was willing to come back at another time.  Otherwise patient will follow up with me again in 6 weeks to see how patient responds.   Once again did discuss with patient about the possibility of infection with patient having the ORIF and to seek medical attention if anything such as redness or swelling occurs   Update 10/18/2020 AKITA MAXIM is a 65 y.o. female coming in with complaint of L hip pain. Patient states that she has little relief from the injection long-term.  Patient states initially maybe about 80% better but now only about 20% better.  L hip xray 10/04/2020 IMPRESSION: 1. Postsurgical changes of bilateral femurs without acute osseous abnormality.  Patient  does have mild to moderate inferior and anterior arthritic changes of the left hip 2. Large calcified uterine fibroid     Past Medical History:  Diagnosis Date   Arthritis    Past Surgical History:  Procedure Laterality Date   COLONOSCOPY     HIP FRACTURE SURGERY Left    Social History   Socioeconomic History   Marital status: Divorced    Spouse name: Not on file   Number of children: Not on file   Years of education: Not on file   Highest education level: Not on file  Occupational History   Not on file  Tobacco Use   Smoking status: Former    Types: Cigarettes    Quit date: 01/20/1986    Years since quitting: 34.7   Smokeless tobacco: Never  Vaping Use   Vaping Use: Never used  Substance and Sexual Activity   Alcohol use: Yes    Alcohol/week: 7.0 standard drinks    Types: 7 Glasses of wine per week   Drug use: No   Sexual activity: Not on file  Other Topics Concern   Not on file  Social History Narrative   Not on file   Social Determinants of Health   Financial Resource Strain: Not on file  Food Insecurity: Not on file  Transportation Needs: Not on file  Physical Activity: Not on file  Stress: Not on file  Social Connections: Not on file   No Known Allergies Family History  Problem Relation Age of Onset   Colon  cancer Mother    Breast cancer Mother        early 73's   Esophageal cancer Neg Hx    Rectal cancer Neg Hx    Stomach cancer Neg Hx     Current Outpatient Medications (Endocrine & Metabolic):    PREDNISONE PO, Take by mouth. PRN inflammation     Current Outpatient Medications (Respiratory):    diphenhydrAMINE (BENADRYL) 25 MG tablet, Take 25 mg by mouth every 6 (six) hours as needed.   Current Outpatient Medications (Analgesics):    ibuprofen (ADVIL,MOTRIN) 200 MG tablet, Take 200 mg by mouth every 6 (six) hours as needed.     Current Outpatient Medications (Other):    carbamide peroxide (DEBROX) 6.5 % OTIC solution, Place 5 drops  into both ears 2 (two) times daily.   Nutritional Supplements (ESTROVEN PO), Take by mouth daily.   gabapentin (NEURONTIN) 100 MG capsule, Take 2 capsules (200 mg total) by mouth at bedtime.  Current Facility-Administered Medications (Other):    0.9 %  sodium chloride infusion   Reviewed prior external information including notes and imaging from  primary care provider As well as notes that were available from care everywhere and other healthcare systems.  Past medical history, social, surgical and family history all reviewed in electronic medical record.  No pertanent information unless stated regarding to the chief complaint.   Review of Systems:  No headache, visual changes, nausea, vomiting, diarrhea, constipation, dizziness, abdominal pain, skin rash, fevers, chills, night sweats, weight loss, swollen lymph nodes, body aches, joint swelling, chest pain, shortness of breath, mood changes. POSITIVE muscle aches  Objective  Blood pressure 108/74, pulse 70, height 5\' 2"  (1.575 m), weight 121 lb (54.9 kg), SpO2 98 %.   General: No apparent distress alert and oriented x3 mood and affect normal, dressed appropriately.  HEENT: Pupils equal, extraocular movements intact  Respiratory: Patient's speak in full sentences and does not appear short of breath  Cardiovascular: No lower extremity edema, non tender, no erythema  Gait antalgic Left hip exam does have a decrease in internal range of motion fairly significantly to only 10 degrees.  Patient does have limited external rotation as well.  Negative straight leg test.  No significant tenderness in the paraspinal musculature of the lumbar spine.   Impression and Recommendations:     The above documentation has been reviewed and is accurate and complete Lyndal Pulley, DO

## 2020-10-18 ENCOUNTER — Encounter: Payer: Self-pay | Admitting: Family Medicine

## 2020-10-18 ENCOUNTER — Ambulatory Visit (INDEPENDENT_AMBULATORY_CARE_PROVIDER_SITE_OTHER): Payer: BC Managed Care – PPO | Admitting: Family Medicine

## 2020-10-18 ENCOUNTER — Ambulatory Visit: Payer: Self-pay

## 2020-10-18 ENCOUNTER — Other Ambulatory Visit: Payer: Self-pay

## 2020-10-18 VITALS — BP 108/74 | HR 70 | Ht 62.0 in | Wt 121.0 lb

## 2020-10-18 DIAGNOSIS — M25552 Pain in left hip: Secondary | ICD-10-CM

## 2020-10-18 DIAGNOSIS — M1612 Unilateral primary osteoarthritis, left hip: Secondary | ICD-10-CM | POA: Diagnosis not present

## 2020-10-18 NOTE — Assessment & Plan Note (Signed)
Injection gave 80 percent improvement for days but came right back. Affecting sleep affecting daily activities at this point.  Did have a replacement on the contralateral side and patient states that this feels very similar to how the hip felt before him.  I do believe that patient is having anterior and inferior impingement that is contributing to more the discomfort and pain.  At this point I do feel like the likelihood of needing a replacement within the next 1 to 2 years would be nearly 100% and patient is more likely to want to have it done now.  Patient is going to go back to her orthopedic surgeon to discuss surgical intervention.

## 2020-11-07 DIAGNOSIS — M25552 Pain in left hip: Secondary | ICD-10-CM | POA: Diagnosis not present

## 2020-11-08 ENCOUNTER — Other Ambulatory Visit: Payer: Self-pay | Admitting: Orthopaedic Surgery

## 2020-11-09 NOTE — Progress Notes (Signed)
Sent message, via epic in basket, requesting orders in epic from surgeon.  

## 2020-11-12 NOTE — Progress Notes (Addendum)
Anesthesia Review:  PCP: DR Abelino Derrick  Cardiologist : none  Chest x-ray : 2V 05/30/20  EKG : Echo : Stress test: Cardiac Cath :  Activity level: can do a flgiht of stairs without difficulty  Sleep Study/ CPAP : none  Fasting Blood Sugar :      / Checks Blood Sugar -- times a day:   Blood Thinner/ Instructions /Last Dose: ASA / Instructions/ Last Dose :   No covid test- ambulatory surgery.

## 2020-11-13 NOTE — Progress Notes (Signed)
DUE TO COVID-19 ONLY ONE VISITOR IS ALLOWED TO COME WITH YOU AND STAY IN THE WAITING ROOM ONLY DURING PRE OP AND PROCEDURE DAY OF SURGERY.  2 VISITOR  MAY VISIT WITH YOU AFTER SURGERY IN YOUR PRIVATE ROOM DURING VISITING HOURS ONLY!  YOU NEED TO HAVE A COVID 19 TEST ON______AME@_  @_from  8am-3pm _____, THIS TEST MUST BE DONE BEFORE SURGERY,  Covid test is done at [place], [place] [address].  This is a drive thru.  No appt required. Please see map.                 Your procedure is scheduled on:       11/20/2020   Report to [facility] Main  Entrance   Report to admitting at   145pm     Call this number if you have problems the morning of surgery [phone]    REMEMBER: NO  SOLID FOOD CANDY OR GUM AFTER MIDNIGHT. CLEAR LIQUIDS UNTIL     130pm      . NOTHING BY MOUTH EXCEPT CLEAR LIQUIDS UNTIL    130pm  . PLEASE FINISH ENSURE DRINK PER SURGEON ORDER  WHICH NEEDS TO BE COMPLETED AT  130pm    .      CLEAR LIQUID DIET   Foods Allowed                                                                    Coffee and tea, regular and decaf                            Fruit ices (not with fruit pulp)                                      Iced Popsicles                                    Carbonated beverages, regular and diet                                    Cranberry, grape and apple juices Sports drinks like Gatorade Lightly seasoned clear broth or consume(fat free) Sugar, honey syrup ___________________________________________________________________      BRUSH YOUR TEETH MORNING OF SURGERY AND RINSE YOUR MOUTH OUT, NO CHEWING GUM CANDY OR MINTS.     Take these medicines the morning of surgery with A SIP OF WATER: none   DO NOT TAKE ANY DIABETIC MEDICATIONS DAY OF YOUR SURGERY                               You may not have any metal on your body including hair pins and              piercings  Do not wear jewelry, make-up, lotions, powders or perfumes,  deodorant             Do  not wear nail polish on your fingernails.  Do not shave  48 hours prior to surgery.              Men may shave face and neck.   Do not bring valuables to the hospital. [place].  Contacts, dentures or bridgework may not be worn into surgery.  Leave suitcase in the car. After surgery it may be brought to your room.     Patients discharged the day of surgery will not be allowed to drive home. IF YOU ARE HAVING SURGERY AND GOING HOME THE SAME DAY, YOU MUST HAVE AN ADULT TO DRIVE YOU HOME AND BE WITH YOU FOR 24 HOURS. YOU MAY GO HOME BY TAXI OR UBER OR ORTHERWISE, BUT AN ADULT MUST ACCOMPANY YOU HOME AND STAY WITH YOU FOR 24 HOURS.  Name and phone number of your driver:  Special Instructions: N/A              Please read over the following fact sheets you were given: _____________________________________________________________________  [facility] - Preparing for Surgery Before surgery, you can play an important role.  Because skin is not sterile, your skin needs to be as free of germs as possible.  You can reduce the number of germs on your skin by washing with CHG (chlorahexidine gluconate) soap before surgery.  CHG is an antiseptic cleaner which kills germs and bonds with the skin to continue killing germs even after washing. Please DO NOT use if you have an allergy to CHG or antibacterial soaps.  If your skin becomes reddened/irritated stop using the CHG and inform your nurse when you arrive at Short Stay. Do not shave (including legs and underarms) for at least 48 hours prior to the first CHG shower.  You may shave your face/neck. Please follow these instructions carefully:  1.  Shower with CHG Soap the night before surgery and the  morning of Surgery.  2.  If you choose to wash your hair, wash your hair first as usual with your  normal  shampoo.  3.  After you shampoo, rinse your hair and body thoroughly to remove  the  shampoo.                           4.  Use CHG as you would any other liquid soap.  You can apply chg directly  to the skin and wash                       Gently with a scrungie or clean washcloth.  5.  Apply the CHG Soap to your body ONLY FROM THE NECK DOWN.   Do not use on face/ open                           Wound or open sores. Avoid contact with eyes, ears mouth and genitals (private parts).                       Wash face,  Genitals (private parts) with your normal soap.             6.  Wash thoroughly, paying special attention to the area where your surgery  will be performed.  7.  Thoroughly rinse your body with warm  water from the neck down.  8.  DO NOT shower/wash with your normal soap after using and rinsing off  the CHG Soap.                9.  Pat yourself dry with a clean towel.            10.  Wear clean pajamas.            11.  Place clean sheets on your bed the night of your first shower and do not  sleep with pets. Day of Surgery : Do not apply any lotions/deodorants the morning of surgery.  Please wear clean clothes to the hospital/surgery center.  FAILURE TO FOLLOW THESE INSTRUCTIONS MAY RESULT IN THE CANCELLATION OF YOUR SURGERY PATIENT SIGNATURE_________________________________  NURSE SIGNATURE__________________________________  ________________________________________________________________________

## 2020-11-14 ENCOUNTER — Ambulatory Visit: Payer: BC Managed Care – PPO | Admitting: Family Medicine

## 2020-11-14 ENCOUNTER — Encounter (HOSPITAL_COMMUNITY): Payer: Self-pay

## 2020-11-14 ENCOUNTER — Encounter (HOSPITAL_COMMUNITY)
Admission: RE | Admit: 2020-11-14 | Discharge: 2020-11-14 | Disposition: A | Discharge status: Home / Self Care | Payer: BC Managed Care – PPO | Source: Ambulatory Visit | Attending: Orthopaedic Surgery | Admitting: Orthopaedic Surgery

## 2020-11-14 ENCOUNTER — Other Ambulatory Visit: Payer: Self-pay

## 2020-11-14 VITALS — BP 131/79 | HR 89 | Temp 98.5°F | Resp 16 | Ht 62.0 in | Wt 113.0 lb

## 2020-11-14 DIAGNOSIS — Z01818 Encounter for other preprocedural examination: Secondary | ICD-10-CM

## 2020-11-14 DIAGNOSIS — Z87891 Personal history of nicotine dependence: Secondary | ICD-10-CM | POA: Diagnosis not present

## 2020-11-14 DIAGNOSIS — Z01812 Encounter for preprocedural laboratory examination: Secondary | ICD-10-CM | POA: Diagnosis not present

## 2020-11-14 DIAGNOSIS — Y831 Surgical operation with implant of artificial internal device as the cause of abnormal reaction of the patient, or of later complication, without mention of misadventure at the time of the procedure: Secondary | ICD-10-CM | POA: Diagnosis not present

## 2020-11-14 DIAGNOSIS — Z96641 Presence of right artificial hip joint: Secondary | ICD-10-CM | POA: Diagnosis not present

## 2020-11-14 DIAGNOSIS — T8484XA Pain due to internal orthopedic prosthetic devices, implants and grafts, initial encounter: Secondary | ICD-10-CM | POA: Diagnosis not present

## 2020-11-14 HISTORY — DX: Other specified postprocedural states: R11.2

## 2020-11-14 HISTORY — DX: Pneumonia, unspecified organism: J18.9

## 2020-11-14 HISTORY — DX: Other specified postprocedural states: Z98.890

## 2020-11-14 HISTORY — DX: Family history of other specified conditions: Z84.89

## 2020-11-14 HISTORY — DX: Other complications of anesthesia, initial encounter: T88.59XA

## 2020-11-14 LAB — CBC
HCT: 42 % (ref 36.0–46.0)
Hemoglobin: 13.9 g/dL (ref 12.0–15.0)
MCH: 33.1 pg (ref 26.0–34.0)
MCHC: 33.1 g/dL (ref 30.0–36.0)
MCV: 100 fL (ref 80.0–100.0)
Platelets: 249 10*3/uL (ref 150–400)
RBC: 4.2 MIL/uL (ref 3.87–5.11)
RDW: 12.6 % (ref 11.5–15.5)
WBC: 5.6 10*3/uL (ref 4.0–10.5)
nRBC: 0 % (ref 0.0–0.2)

## 2020-11-19 NOTE — H&P (Signed)
Mary [name] is an 65 y.o. female.   Chief Complaint: left hip painful hardware HPI: Mary [name] is quite happy with her replaced right hip.  Unfortunately she has the same symptoms on the left with groin pain on rotation.  She is back on her horse but is having trouble due to the left hip pain.  On this side she had a percutaneous pinning done elsewhere back in 1994.  She basically would like a hip replacement on this side.  She had an injection which relieved her pain transiently.   X-rays: I reviewed some films on the [facility] system taken earlier this year.  She has 3 well-placed hip screws with some degenerative change about the hip joint.  Past Medical History:  Diagnosis Date   Arthritis    Complication of anesthesia    Family history of adverse reaction to anesthesia    mother- syncope, N/V   Pneumonia    hx of years ago   PONV (postoperative nausea and vomiting)     Past Surgical History:  Procedure Laterality Date   COLONOSCOPY     HIP FRACTURE SURGERY Left    right hip replacement       Family History  Problem Relation Age of Onset   Colon cancer Mother    Breast cancer Mother        early 108's   Esophageal cancer Neg Hx    Rectal cancer Neg Hx    Stomach cancer Neg Hx    Social History:  reports that she quit smoking about 34 years ago. Her smoking use included cigarettes. She has never used smokeless tobacco. She reports current alcohol use of about 7.0 standard drinks per week. She reports that she does not use drugs.  Allergies: No Known Allergies  No medications prior to admission.    No results found for this or any previous visit (from the past 48 hour(s)). No results found.  Review of Systems  Musculoskeletal:  Positive for arthralgias.       Left hip  All other systems reviewed and are negative.  There were no vitals taken for this visit. Physical Exam Constitutional:      Appearance: Normal appearance.  HENT:     Head: Normocephalic and atraumatic.      Nose: Nose normal.     Mouth/Throat:     Pharynx: Oropharynx is clear.  Eyes:     Extraocular Movements: Extraocular movements intact.  Cardiovascular:     Rate and Rhythm: Normal rate and regular rhythm.  Pulmonary:     Effort: Pulmonary effort is normal.  Abdominal:     Palpations: Abdomen is soft.  Musculoskeletal:     Cervical back: Normal range of motion.     Comments: Left hip is tender over the lateral aspect but she also has a great deal of pain to flexion and internal rotation.  Leg lengths are roughly equal.  She is walking with a relatively normal gait.    Skin:    General: Skin is warm and dry.  Neurological:     General: No focal deficit present.     Mental Status: She is alert and oriented to person, place, and time.  Psychiatric:        Mood and Affect: Mood normal.        Behavior: Behavior normal.        Thought Content: Thought content normal.        Judgment: Judgment normal.     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  I have concerns about a 1 stage procedure as her bones may be a bit thin due to her chronic use of prednisone.  I have suggested we simply remove the hardware and then give her a couple of months to heal up.  If she persists with pain we could then talk about hip replacement.I reviewed the risk of anesthesia, infection, DVT, and bleeding and she is in agreement and would like to proceed.  Larwance Sachs Dunya Meiners, PA-C 11/19/2020, 5:18 PM

## 2020-11-20 ENCOUNTER — Encounter (HOSPITAL_COMMUNITY)
Admission: RE | Disposition: A | Discharge status: Home / Self Care | Payer: Self-pay | Source: Ambulatory Visit | Attending: Orthopaedic Surgery

## 2020-11-20 ENCOUNTER — Encounter (HOSPITAL_COMMUNITY): Payer: Self-pay | Admitting: Orthopaedic Surgery

## 2020-11-20 ENCOUNTER — Ambulatory Visit (HOSPITAL_COMMUNITY): Payer: BC Managed Care – PPO

## 2020-11-20 ENCOUNTER — Ambulatory Visit (HOSPITAL_COMMUNITY): Payer: BC Managed Care – PPO | Admitting: Certified Registered Nurse Anesthetist

## 2020-11-20 ENCOUNTER — Ambulatory Visit (HOSPITAL_COMMUNITY)
Admission: RE | Admit: 2020-11-20 | Discharge: 2020-11-20 | Disposition: A | Discharge status: Home / Self Care | Payer: BC Managed Care – PPO | Source: Ambulatory Visit | Attending: Orthopaedic Surgery | Admitting: Orthopaedic Surgery

## 2020-11-20 DIAGNOSIS — Z01818 Encounter for other preprocedural examination: Secondary | ICD-10-CM

## 2020-11-20 DIAGNOSIS — Z472 Encounter for removal of internal fixation device: Secondary | ICD-10-CM | POA: Diagnosis not present

## 2020-11-20 DIAGNOSIS — Z96641 Presence of right artificial hip joint: Secondary | ICD-10-CM | POA: Diagnosis not present

## 2020-11-20 DIAGNOSIS — M25552 Pain in left hip: Secondary | ICD-10-CM | POA: Diagnosis present

## 2020-11-20 DIAGNOSIS — T8484XA Pain due to internal orthopedic prosthetic devices, implants and grafts, initial encounter: Secondary | ICD-10-CM | POA: Diagnosis not present

## 2020-11-20 DIAGNOSIS — Z87891 Personal history of nicotine dependence: Secondary | ICD-10-CM | POA: Diagnosis not present

## 2020-11-20 DIAGNOSIS — Y831 Surgical operation with implant of artificial internal device as the cause of abnormal reaction of the patient, or of later complication, without mention of misadventure at the time of the procedure: Secondary | ICD-10-CM | POA: Insufficient documentation

## 2020-11-20 DIAGNOSIS — M5416 Radiculopathy, lumbar region: Secondary | ICD-10-CM | POA: Diagnosis not present

## 2020-11-20 DIAGNOSIS — M1612 Unilateral primary osteoarthritis, left hip: Secondary | ICD-10-CM

## 2020-11-20 DIAGNOSIS — Z419 Encounter for procedure for purposes other than remedying health state, unspecified: Secondary | ICD-10-CM

## 2020-11-20 HISTORY — PX: HARDWARE REMOVAL: SHX979

## 2020-11-20 SURGERY — REMOVAL, HARDWARE
Anesthesia: General | Site: Hip | Laterality: Left

## 2020-11-20 MED ORDER — TRAMADOL HCL 50 MG PO TABS: 50.0000 mg | ORAL_TABLET | Freq: Four times a day (QID) | ORAL | 0 refills | Status: AC | PRN

## 2020-11-20 MED ORDER — APREPITANT 40 MG PO CAPS
40.0000 mg | ORAL_CAPSULE | Freq: Once | ORAL | Status: AC
  Administered 2020-11-20: 40 mg via ORAL
  Filled 2020-11-20: qty 1

## 2020-11-20 MED ORDER — BUPIVACAINE-EPINEPHRINE (PF) 0.5% -1:200000 IJ SOLN
INTRAMUSCULAR | Status: AC
  Filled 2020-11-20: qty 30

## 2020-11-20 MED ORDER — LIDOCAINE 2% (20 MG/ML) 5 ML SYRINGE
INTRAMUSCULAR | Status: DC | PRN
  Administered 2020-11-20: 40 mg via INTRAVENOUS

## 2020-11-20 MED ORDER — PHENYLEPHRINE 40 MCG/ML (10ML) SYRINGE FOR IV PUSH (FOR BLOOD PRESSURE SUPPORT)
PREFILLED_SYRINGE | INTRAVENOUS | Status: AC
  Filled 2020-11-20: qty 10

## 2020-11-20 MED ORDER — LIDOCAINE HCL (PF) 2 % IJ SOLN
INTRAMUSCULAR | Status: AC
  Filled 2020-11-20: qty 5

## 2020-11-20 MED ORDER — 0.9 % SODIUM CHLORIDE (POUR BTL) OPTIME
TOPICAL | Status: DC | PRN
  Administered 2020-11-20: 1000 mL

## 2020-11-20 MED ORDER — FENTANYL CITRATE PF 50 MCG/ML IJ SOSY
25.0000 ug | PREFILLED_SYRINGE | INTRAMUSCULAR | Status: DC | PRN
  Administered 2020-11-20: 50 ug via INTRAVENOUS
  Administered 2020-11-20: 25 ug via INTRAVENOUS

## 2020-11-20 MED ORDER — DEXAMETHASONE SODIUM PHOSPHATE 10 MG/ML IJ SOLN
INTRAMUSCULAR | Status: AC
  Filled 2020-11-20: qty 1

## 2020-11-20 MED ORDER — ROCURONIUM BROMIDE 10 MG/ML (PF) SYRINGE
PREFILLED_SYRINGE | INTRAVENOUS | Status: DC | PRN
  Administered 2020-11-20: 50 mg via INTRAVENOUS

## 2020-11-20 MED ORDER — CEFAZOLIN SODIUM-DEXTROSE 2-4 GM/100ML-% IV SOLN
2.0000 g | INTRAVENOUS | Status: AC
  Administered 2020-11-20: 2 g via INTRAVENOUS
  Filled 2020-11-20: qty 100

## 2020-11-20 MED ORDER — FENTANYL CITRATE (PF) 100 MCG/2ML IJ SOLN
INTRAMUSCULAR | Status: AC
  Filled 2020-11-20: qty 2

## 2020-11-20 MED ORDER — CHLORHEXIDINE GLUCONATE 0.12 % MT SOLN
15.0000 mL | Freq: Once | OROMUCOSAL | Status: AC
  Administered 2020-11-20: 15 mL via OROMUCOSAL

## 2020-11-20 MED ORDER — PROPOFOL 10 MG/ML IV BOLUS
INTRAVENOUS | Status: AC
  Filled 2020-11-20: qty 20

## 2020-11-20 MED ORDER — ONDANSETRON HCL 4 MG/2ML IJ SOLN
INTRAMUSCULAR | Status: AC
  Filled 2020-11-20: qty 2

## 2020-11-20 MED ORDER — PROMETHAZINE HCL 12.5 MG PO TABS: 12.5000 mg | ORAL_TABLET | Freq: Four times a day (QID) | ORAL | 0 refills | Status: DC | PRN

## 2020-11-20 MED ORDER — ACETAMINOPHEN 500 MG PO TABS
1000.0000 mg | ORAL_TABLET | Freq: Once | ORAL | Status: AC
  Administered 2020-11-20: 1000 mg via ORAL
  Filled 2020-11-20: qty 2

## 2020-11-20 MED ORDER — FENTANYL CITRATE (PF) 100 MCG/2ML IJ SOLN
INTRAMUSCULAR | Status: DC | PRN
  Administered 2020-11-20 (×2): 50 ug via INTRAVENOUS

## 2020-11-20 MED ORDER — SUGAMMADEX SODIUM 200 MG/2ML IV SOLN
INTRAVENOUS | Status: DC | PRN
  Administered 2020-11-20: 200 mg via INTRAVENOUS

## 2020-11-20 MED ORDER — ONDANSETRON HCL 4 MG/2ML IJ SOLN
INTRAMUSCULAR | Status: DC | PRN
  Administered 2020-11-20: 4 mg via INTRAVENOUS

## 2020-11-20 MED ORDER — EPHEDRINE 5 MG/ML INJ
INTRAVENOUS | Status: AC
  Filled 2020-11-20: qty 5

## 2020-11-20 MED ORDER — DEXAMETHASONE SODIUM PHOSPHATE 10 MG/ML IJ SOLN
INTRAMUSCULAR | Status: DC | PRN
  Administered 2020-11-20: 10 mg via INTRAVENOUS

## 2020-11-20 MED ORDER — MIDAZOLAM HCL 5 MG/5ML IJ SOLN
INTRAMUSCULAR | Status: DC | PRN
  Administered 2020-11-20: 2 mg via INTRAVENOUS

## 2020-11-20 MED ORDER — LACTATED RINGERS IV SOLN: INTRAVENOUS | Status: DC

## 2020-11-20 MED ORDER — PROPOFOL 10 MG/ML IV BOLUS
INTRAVENOUS | Status: DC | PRN
  Administered 2020-11-20: 50 mg via INTRAVENOUS
  Administered 2020-11-20: 100 mg via INTRAVENOUS

## 2020-11-20 MED ORDER — FENTANYL CITRATE PF 50 MCG/ML IJ SOSY
PREFILLED_SYRINGE | INTRAMUSCULAR | Status: AC
  Filled 2020-11-20: qty 2

## 2020-11-20 MED ORDER — MIDAZOLAM HCL 2 MG/2ML IJ SOLN
INTRAMUSCULAR | Status: AC
  Filled 2020-11-20: qty 2

## 2020-11-20 MED ORDER — EPHEDRINE SULFATE-NACL 50-0.9 MG/10ML-% IV SOSY
PREFILLED_SYRINGE | INTRAVENOUS | Status: DC | PRN
  Administered 2020-11-20: 5 mg via INTRAVENOUS

## 2020-11-20 MED ORDER — PHENYLEPHRINE 40 MCG/ML (10ML) SYRINGE FOR IV PUSH (FOR BLOOD PRESSURE SUPPORT)
PREFILLED_SYRINGE | INTRAVENOUS | Status: DC | PRN
  Administered 2020-11-20: 80 ug via INTRAVENOUS

## 2020-11-20 MED ORDER — BUPIVACAINE-EPINEPHRINE 0.5% -1:200000 IJ SOLN
INTRAMUSCULAR | Status: DC | PRN
  Administered 2020-11-20: 10 mL

## 2020-11-20 MED ORDER — SCOPOLAMINE 1 MG/3DAYS TD PT72
1.0000 | MEDICATED_PATCH | TRANSDERMAL | Status: DC
  Administered 2020-11-20: 1.5 mg via TRANSDERMAL
  Filled 2020-11-20: qty 1

## 2020-11-20 SURGICAL SUPPLY — 25 items
BAG COUNTER SPONGE SURGICOUNT (BAG) IMPLANT
COVER MAYO STAND STRL (DRAPES) ×2 IMPLANT
COVER SURGICAL LIGHT HANDLE (MISCELLANEOUS) ×2 IMPLANT
DRAPE C-ARM 42X120 X-RAY (DRAPES) ×2 IMPLANT
DRAPE ORTHO 2.5IN SPLIT 77X108 (DRAPES) IMPLANT
DRAPE ORTHO SPLIT 77X108 STRL (DRAPES) ×4
DRAPE SURG ORHT 6 SPLT 77X108 (DRAPES) ×2 IMPLANT
DRAPE U-SHAPE 47X51 STRL (DRAPES) ×2 IMPLANT
DRSG AQUACEL AG ADV 3.5X 6 (GAUZE/BANDAGES/DRESSINGS) ×2 IMPLANT
DURAPREP 26ML APPLICATOR (WOUND CARE) ×2 IMPLANT
GLOVE SURG ENC MOIS LTX SZ8.5 (GLOVE) ×8 IMPLANT
KIT BASIN OR (CUSTOM PROCEDURE TRAY) ×2 IMPLANT
NEEDLE HYPO 22GX1.5 SAFETY (NEEDLE) ×2 IMPLANT
PACK GENERAL/GYN (CUSTOM PROCEDURE TRAY) ×2 IMPLANT
PENCIL SMOKE EVACUATOR (MISCELLANEOUS) IMPLANT
PROTECTOR NERVE ULNAR (MISCELLANEOUS) ×2 IMPLANT
SPONGE T-LAP 18X18 ~~LOC~~+RFID (SPONGE) ×4 IMPLANT
STRIP CLOSURE SKIN 1/2X4 (GAUZE/BANDAGES/DRESSINGS) ×2 IMPLANT
SUT MNCRL AB 3-0 PS2 18 (SUTURE) IMPLANT
SUT VIC AB 1 CT1 36 (SUTURE) ×2 IMPLANT
SUT VIC AB 2-0 CT1 27 (SUTURE) ×2
SUT VIC AB 2-0 CT1 TAPERPNT 27 (SUTURE) ×1 IMPLANT
SUT VIC AB 3-0 CT1 27 (SUTURE) ×2
SUT VIC AB 3-0 CT1 TAPERPNT 27 (SUTURE) ×1 IMPLANT
SYR CONTROL 10ML LL (SYRINGE) ×2 IMPLANT

## 2020-11-20 NOTE — Transfer of Care (Signed)
Immediate Anesthesia Transfer of Care Note  Patient: Mary [name]  Procedure(s) Performed: LEFT HIP HARDWARE REMOVAL (Left: Hip)  Patient Location: PACU  Anesthesia Type:General  Level of Consciousness: awake, alert  and oriented  Airway & Oxygen Therapy: Patient Spontanous Breathing and Patient connected to face mask oxygen  Post-op Assessment: Report given to RN and Post -op Vital signs reviewed and stable  Post vital signs: Reviewed and stable  Last Vitals:  Vitals Value Taken Time  BP    Temp    Pulse 93 11/20/20 1304  Resp 15 11/20/20 1304  SpO2 100 % 11/20/20 1304  Vitals shown include unvalidated device data.  Last Pain:  Vitals:   11/20/20 0946  TempSrc:   PainSc: 0-No pain         Complications: No notable events documented.

## 2020-11-20 NOTE — Anesthesia Preprocedure Evaluation (Addendum)
Anesthesia Evaluation  Patient identified by MRN, date of birth, ID band Patient awake    Reviewed: Allergy & Precautions, NPO status , Patient's Chart, lab work & pertinent test results  History of Anesthesia Complications (+) PONV  Airway Mallampati: III  TM Distance: >3 FB Neck ROM: Full    Dental  (+) Chipped, Dental Advisory Given,    Pulmonary neg pulmonary ROS, former smoker,    Pulmonary exam normal breath sounds clear to auscultation       Cardiovascular negative cardio ROS Normal cardiovascular exam Rhythm:Regular Rate:Normal     Neuro/Psych negative neurological ROS  negative psych ROS   GI/Hepatic negative GI ROS, Neg liver ROS,   Endo/Other  negative endocrine ROS  Renal/GU negative Renal ROS  negative genitourinary   Musculoskeletal  (+) Arthritis ,   Abdominal   Peds  Hematology negative hematology ROS (+)   Anesthesia Other Findings   Reproductive/Obstetrics                            Anesthesia Physical Anesthesia Plan  ASA: 2  Anesthesia Plan: General   Post-op Pain Management:    Induction: Intravenous  PONV Risk Score and Plan: 4 or greater and Midazolam, Dexamethasone, Ondansetron and Scopolamine patch - Pre-op  Airway Management Planned: Oral ETT  Additional Equipment:   Intra-op Plan:   Post-operative Plan: Extubation in OR  Informed Consent: I have reviewed the patients History and Physical, chart, labs and discussed the procedure including the risks, benefits and alternatives for the proposed anesthesia with the patient or authorized representative who has indicated his/her understanding and acceptance.     Dental advisory given  Plan Discussed with: CRNA  Anesthesia Plan Comments:        Anesthesia Quick Evaluation

## 2020-11-20 NOTE — Op Note (Signed)
NAME: VAIL, BASISTA MEDICAL RECORD NO: [id number] ACCOUNT NO: [id number] DATE OF BIRTH: May 18, 1955 FACILITY: [facility] LOCATION: [facility] PHYSICIAN: Monico Blitz. Rhona Raider, MD  Operative Report   DATE OF PROCEDURE: 11/20/2020   PREOPERATIVE DIAGNOSIS:  Painful left hip hardware.  POSTOPERATIVE DIAGNOSIS:  Painful left hip hardware.  PROCEDURE:  Removal of hardware, left hip.  ANESTHESIA:  General.  ATTENDING SURGEON:  Monico Blitz. Rhona Raider, MD  ASSISTANT:  Loni Dolly, PA.  INDICATIONS:  The patient is a 65 year old woman many years out from a left hip pinning for a femoral neck fracture.  The fracture has healed well, but she has been left with some painful hardware and/or degenerative hip, which we are planning to replace  in a couple of months.  She is offered hardware removal at this point.  Informed operative consent was obtained after discussion of the possible complications including, reaction to anesthesia, infection.  SUMMARY OF FINDINGS AND PROCEDURE:  Under general anesthesia through a portion of her old incision three cannulated hip screws were removed with moderate difficulty.  I did use fluoroscopy throughout the case to make appropriate intraoperative decisions  and read all these views myself.  DESCRIPTION OF PROCEDURE:  The patient was taken to the operating suite where general anesthetic was applied without difficulty.  She was positioned in sloppy lateral position on a beanbag.  She was then prepped and draped in normal sterile fashion.   After the administration of preoperative IV Kefzol and appropriate timeout, we utilized a small portion of her old incision with dissection down to the 3 screws.  They were removed with the star shaped drill bit with only some moderate difficulty as we  encountered the lateral cortex.  I used fluoroscopy to confirm adequate removal.  The wound was then irrigated, followed by reapproximation of the deep tissues with Vicryl and skin with a  subcuticular stitch.  Adaptic was applied followed by dry gauze  dressing.  ESTIMATED BLOOD LOSS AND INTRAOPERATIVE FLUID: Was obtained from anesthesia records.  DISPOSITION:  The patient was extubated in the operating room and taken to recovery room in stable condition.  Plans were for her to go home same day and follow up in the office in less than 1 week.     SUJ D: 11/20/2020 12:56:50 pm T: 11/20/2020 10:29:00 pm  JOB: [id number]/ [id number]

## 2020-11-20 NOTE — Progress Notes (Signed)
Orthopedic Tech Progress Note Patient Details:  Mary [name] 05-Nov-1955 [id number]  Patient ID: [name], female   DOB: [birth year], 65 y.o.   MRN: [id number] PACU nurse called because order is not showing up correctly, pt received their crutches and did not want to try using them because they stated they "already knew how". Vernona Rieger 11/20/2020, 2:20 PM

## 2020-11-20 NOTE — Anesthesia Postprocedure Evaluation (Signed)
Anesthesia Post Note  Patient: Mary [name]  Procedure(s) Performed: LEFT HIP HARDWARE REMOVAL (Left: Hip)     Patient location during evaluation: PACU Anesthesia Type: General Level of consciousness: awake and alert Pain management: pain level controlled Vital Signs Assessment: post-procedure vital signs reviewed and stable Respiratory status: spontaneous breathing, nonlabored ventilation, respiratory function stable and patient connected to nasal cannula oxygen Cardiovascular status: blood pressure returned to baseline and stable Postop Assessment: no apparent nausea or vomiting Anesthetic complications: no   No notable events documented.  Last Vitals:  Vitals:   11/20/20 1304 11/20/20 1315  BP: 134/76 124/74  Pulse: 91 81  Resp: 14 13  Temp: 36.8 C   SpO2: 100% 100%    Last Pain:  Vitals:   11/20/20 1330  TempSrc:   PainSc: 3                  Mary [name] L Ahtziri Jeffries

## 2020-11-20 NOTE — Anesthesia Procedure Notes (Signed)
Procedure Name: Intubation Date/Time: 11/20/2020 12:10 PM Performed by: Maxwell Caul, CRNA Pre-anesthesia Checklist: Patient identified, Emergency Drugs available, Suction available and Patient being monitored Patient Re-evaluated:Patient Re-evaluated prior to induction Oxygen Delivery Method: Circle system utilized Preoxygenation: Pre-oxygenation with 100% oxygen Induction Type: IV induction Ventilation: Mask ventilation without difficulty Laryngoscope Size: Mac and 4 Grade View: Grade I Tube type: Oral Tube size: 7.5 mm Number of attempts: 1 Airway Equipment and Method: Stylet Placement Confirmation: ETT inserted through vocal cords under direct vision, positive ETCO2 and breath sounds checked- equal and bilateral Secured at: 21 cm Tube secured with: Tape Dental Injury: Teeth and Oropharynx as per pre-operative assessment

## 2020-11-20 NOTE — Interval H&P Note (Signed)
History and Physical Interval Note:  11/20/2020 11:28 AM  Mary [name]  has presented today for surgery, with the diagnosis of LEFT HIP PAINFUL HARDWARE.  The various methods of treatment have been discussed with the patient and family. After consideration of risks, benefits and other options for treatment, the patient has consented to  Procedure(s): LEFT HIP HARDWARE REMOVAL (Left) as a surgical intervention.  The patient's history has been reviewed, patient examined, no change in status, stable for surgery.  I have reviewed the patient's chart and labs.  Questions were answered to the patient's satisfaction.     Hessie Dibble

## 2020-11-20 NOTE — Brief Op Note (Signed)
Mary [name] [id number] 11/20/2020   PRE-OP DIAGNOSIS: left hip painful hardware  POST-OP DIAGNOSIS: same  PROCEDURE: removal hardware left hip  ANESTHESIA: general  Hessie Dibble   Dictation #:  [id number]

## 2020-11-21 ENCOUNTER — Encounter (HOSPITAL_COMMUNITY): Payer: Self-pay | Admitting: Orthopaedic Surgery

## 2020-12-17 ENCOUNTER — Telehealth: Payer: Self-pay | Admitting: Family Medicine

## 2020-12-18 NOTE — Telephone Encounter (Signed)
Called patient to inform of message below, no answer LMTCB 

## 2020-12-18 NOTE — Telephone Encounter (Signed)
Patient seen on 05/30/20 for new patient and pre-op visit for surgery on left hip per patient she is having surgery on right hip in a few weeks. Would like to know if Dr. Ethelene Hal could order the same labs that she had prior to her surgery on the left hip surgeon asking for PCP to order labs. Please advise.

## 2020-12-20 ENCOUNTER — Telehealth: Payer: Self-pay

## 2020-12-20 DIAGNOSIS — Z01818 Encounter for other preprocedural examination: Secondary | ICD-10-CM

## 2020-12-20 NOTE — Telephone Encounter (Signed)
Patient calling back states that she called the surgeon informed them that Dr. Ethelene Hal had left patient know that she was cleared for surgery and no need for new labs due to recent labs this year for first surgery. Per patient and surgeon it is a requirement that she has labs 30 days prior to surgery. Would like to know if labs could be ordered for her to come in next week to have drawn? Please advise

## 2020-12-21 NOTE — Telephone Encounter (Signed)
Appointment scheduled.

## 2020-12-25 ENCOUNTER — Other Ambulatory Visit (INDEPENDENT_AMBULATORY_CARE_PROVIDER_SITE_OTHER): Payer: BC Managed Care – PPO

## 2020-12-25 ENCOUNTER — Other Ambulatory Visit: Payer: Self-pay

## 2020-12-25 DIAGNOSIS — Z01818 Encounter for other preprocedural examination: Secondary | ICD-10-CM

## 2020-12-25 LAB — BASIC METABOLIC PANEL
BUN: 13 mg/dL (ref 6–23)
CO2: 29 mEq/L (ref 19–32)
Calcium: 9.3 mg/dL (ref 8.4–10.5)
Chloride: 103 mEq/L (ref 96–112)
Creatinine, Ser: 0.81 mg/dL (ref 0.40–1.20)
GFR: 76.71 mL/min (ref >60.00)
Glucose, Bld: 77 mg/dL (ref 70–99)
Potassium: 4.1 mEq/L (ref 3.5–5.1)
Sodium: 139 mEq/L (ref 135–145)

## 2020-12-25 LAB — CBC
HCT: 38.7 % (ref 36.0–46.0)
Hemoglobin: 12.7 g/dL (ref 12.0–15.0)
MCHC: 32.9 g/dL (ref 30.0–36.0)
MCV: 99.4 fl (ref 78.0–100.0)
Platelets: 282 10*3/uL (ref 150.0–400.0)
RBC: 3.89 Mil/uL (ref 3.87–5.11)
RDW: 13.3 % (ref 11.5–15.5)
WBC: 6.1 10*3/uL (ref 4.0–10.5)

## 2020-12-25 NOTE — Progress Notes (Signed)
Per the orders of Dr. Ethelene Hal, pt is here for labs pt tolertaed draw well.

## 2020-12-31 DIAGNOSIS — M25552 Pain in left hip: Secondary | ICD-10-CM | POA: Diagnosis not present

## 2021-01-01 ENCOUNTER — Telehealth: Payer: Self-pay

## 2021-01-01 DIAGNOSIS — Z01818 Encounter for other preprocedural examination: Secondary | ICD-10-CM

## 2021-01-01 NOTE — Telephone Encounter (Signed)
Mary [name] from [place] called about a Pt INR test that they had requested be drawn here as part of their surgery clearance.Her surgery is scheduled 01/10/21.  They need this asap. Can an order be placed?

## 2021-01-01 NOTE — Telephone Encounter (Signed)
Please advise message below  °

## 2021-01-10 DIAGNOSIS — M1612 Unilateral primary osteoarthritis, left hip: Secondary | ICD-10-CM | POA: Diagnosis not present

## 2021-05-01 DIAGNOSIS — M47896 Other spondylosis, lumbar region: Secondary | ICD-10-CM | POA: Diagnosis not present

## 2021-06-20 ENCOUNTER — Ambulatory Visit: Payer: BC Managed Care – PPO | Admitting: Family Medicine

## 2021-07-08 NOTE — Progress Notes (Unsigned)
[name] Phone: [phone] Subjective:   [name], am serving as a scribe for Dr. Hulan Saas.   I'm seeing this patient by the request  of:  Mary Maw, MD  CC: Hip pain follow-up  AYT:[id number]  10/18/2020 Injection gave 80 percent improvement for days but came right back. Affecting sleep affecting daily activities at this point.  Did have a replacement on the contralateral side and patient states that this feels very similar to how the hip felt before him.  I do believe that patient is having anterior and inferior impingement that is contributing to more the discomfort and pain.  At this point I do feel like the likelihood of needing a replacement within the next 1 to 2 years would be nearly 100% and patient is more likely to want to have it done now.  Patient is going to go back to her orthopedic surgeon to discuss surgical intervention.  Updated 07/09/2021 Mary [name] is a 66 y.o. female coming in with complaint of back pain. Had total hip surgery in December 2022 and back pain has been getting worse since that time. Pain in her back is on left side but can be on right side when it is at it's worst. As the day progresses her pain increases due to walking, riding horses and cleaning stalls. Numbness radiating down lateral aspect of both legs. Takes naprosen in morning. Using old rx for gabapentin at night.        Past Medical History:  Diagnosis Date   Arthritis    Complication of anesthesia    Family history of adverse reaction to anesthesia    mother- syncope, N/V   Pneumonia    hx of years ago   PONV (postoperative nausea and vomiting)    Past Surgical History:  Procedure Laterality Date   COLONOSCOPY     HARDWARE REMOVAL Left 11/20/2020   Procedure: LEFT HIP HARDWARE REMOVAL;  Surgeon: Melrose Nakayama, MD;  Location: [facility];  Service: Orthopedics;  Laterality: Left;   HIP  FRACTURE SURGERY Left    right hip replacement      Social History   Socioeconomic History   Marital status: Divorced    Spouse name: Not on file   Number of children: Not on file   Years of education: Not on file   Highest education level: Not on file  Occupational History   Not on file  Tobacco Use   Smoking status: Former    Types: Cigarettes    Quit date: 01/20/1986    Years since quitting: 35.4   Smokeless tobacco: Never  Vaping Use   Vaping Use: Never used  Substance and Sexual Activity   Alcohol use: Yes    Alcohol/week: 7.0 standard drinks of alcohol    Types: 7 Glasses of wine per week    Comment: glass wine every day   Drug use: Never   Sexual activity: Not on file  Other Topics Concern   Not on file  Social History Narrative   Not on file   Social Determinants of Health   Financial Resource Strain: Not on file  Food Insecurity: Not on file  Transportation Needs: Not on file  Physical Activity: Not on file  Stress: Not on file  Social Connections: Not on file   No Known Allergies Family History  Problem Relation Age of Onset   Colon cancer Mother    Breast cancer  Mother        early 6's   Esophageal cancer Neg Hx    Rectal cancer Neg Hx    Stomach cancer Neg Hx       Current Outpatient Medications (Respiratory):    promethazine (PHENERGAN) 12.5 MG tablet, Take 1-2 tablets (12.5-25 mg total) by mouth every 6 (six) hours as needed for nausea or vomiting.  Current Outpatient Medications (Analgesics):    traMADol (ULTRAM) 50 MG tablet, Take 1-2 tablets (50-100 mg total) by mouth every 6 (six) hours as needed for moderate pain or severe pain (post op pain).   Current Outpatient Medications (Other):    CALCIUM PO, Take 1 tablet by mouth daily.   carbamide peroxide (DEBROX) 6.5 % OTIC solution, Place 5 drops into both ears 2 (two) times daily.   cholecalciferol (VITAMIN D3) 25 MCG (1000 UNIT) tablet, Take 1,000 Units by mouth daily.   Multiple  Vitamins-Minerals (WOMENS MULTIVITAMIN PO), Take by mouth.   tetrahydrozoline 0.05 % ophthalmic solution, Place 1 drop into both eyes daily as needed (dry eyes).   Reviewed prior external information including notes and imaging from  primary care provider As well as notes that were available from care everywhere and other healthcare systems.  Past medical history, social, surgical and family history all reviewed in electronic medical record.  No pertanent information unless stated regarding to the chief complaint.   Review of Systems:  No headache, visual changes, nausea, vomiting, diarrhea, constipation, dizziness, abdominal pain, skin rash, fevers, chills, night sweats, weight loss, swollen lymph nodes, body aches, joint swelling, chest pain, shortness of breath, mood changes. POSITIVE muscle aches  Objective  Blood pressure 120/82, pulse (!) 58, height '5\' 2"'$  (1.575 m), weight 121 lb (54.9 kg), SpO2 98 %.   General: No apparent distress alert and oriented x3 mood and affect normal, dressed appropriately.  HEENT: Pupils equal, extraocular movements intact  Respiratory: Patient's speak in full sentences and does not appear short of breath  Cardiovascular: No lower extremity edema, non tender, no erythema  Left hip does have the postsurgical changes.  Patient does have weakness with 4 out of 5 strength compared to the contralateral side.  The patient does have pain with internal rotation of the hip noted.  Patient does have some mild audible clicking noted.  Low back exam does have some loss of lordosis.  Patient does have some tenderness to palpation as well noted.  Tightness with straight leg test.  Worsening pain with extension of the back noted.    Impression and Recommendations:     The above documentation has been reviewed and is accurate and complete Lyndal Pulley, DO

## 2021-07-09 ENCOUNTER — Ambulatory Visit (INDEPENDENT_AMBULATORY_CARE_PROVIDER_SITE_OTHER): Payer: PPO

## 2021-07-09 ENCOUNTER — Ambulatory Visit: Payer: PPO | Admitting: Family Medicine

## 2021-07-09 VITALS — BP 120/82 | HR 58 | Ht 62.0 in | Wt 121.0 lb

## 2021-07-09 DIAGNOSIS — M25552 Pain in left hip: Secondary | ICD-10-CM | POA: Diagnosis not present

## 2021-07-09 DIAGNOSIS — M2550 Pain in unspecified joint: Secondary | ICD-10-CM | POA: Diagnosis not present

## 2021-07-09 DIAGNOSIS — M5416 Radiculopathy, lumbar region: Secondary | ICD-10-CM | POA: Diagnosis not present

## 2021-07-09 LAB — COMPREHENSIVE METABOLIC PANEL
ALT: 15 U/L (ref 0–35)
AST: 18 U/L (ref 0–37)
Albumin: 4.4 g/dL (ref 3.5–5.2)
Alkaline Phosphatase: 52 U/L (ref 39–117)
BUN: 10 mg/dL (ref 6–23)
CO2: 27 mEq/L (ref 19–32)
Calcium: 9.4 mg/dL (ref 8.4–10.5)
Chloride: 103 mEq/L (ref 96–112)
Creatinine, Ser: 0.66 mg/dL (ref 0.40–1.20)
GFR: 92.35 mL/min (ref >60.00)
Glucose, Bld: 92 mg/dL (ref 70–99)
Potassium: 3.8 mEq/L (ref 3.5–5.1)
Sodium: 138 mEq/L (ref 135–145)
Total Bilirubin: 0.4 mg/dL (ref 0.2–1.2)
Total Protein: 7 g/dL (ref 6.0–8.3)

## 2021-07-09 LAB — CBC WITH DIFFERENTIAL/PLATELET
Basophils Absolute: 0 10*3/uL (ref 0.0–0.1)
Basophils Relative: 0.5 % (ref 0.0–3.0)
Eosinophils Absolute: 0.1 10*3/uL (ref 0.0–0.7)
Eosinophils Relative: 1.7 % (ref 0.0–5.0)
HCT: 37.6 % (ref 36.0–46.0)
Hemoglobin: 12.5 g/dL (ref 12.0–15.0)
Lymphocytes Relative: 35.6 % (ref 12.0–46.0)
Lymphs Abs: 2.1 10*3/uL (ref 0.7–4.0)
MCHC: 33.3 g/dL (ref 30.0–36.0)
MCV: 99.5 fl (ref 78.0–100.0)
Monocytes Absolute: 0.4 10*3/uL (ref 0.1–1.0)
Monocytes Relative: 6.8 % (ref 3.0–12.0)
Neutro Abs: 3.3 10*3/uL (ref 1.4–7.7)
Neutrophils Relative %: 55.4 % (ref 43.0–77.0)
Platelets: 238 10*3/uL (ref 150.0–400.0)
RBC: 3.78 Mil/uL — ABNORMAL LOW (ref 3.87–5.11)
RDW: 13.2 % (ref 11.5–15.5)
WBC: 6 10*3/uL (ref 4.0–10.5)

## 2021-07-09 LAB — SEDIMENTATION RATE: Sed Rate: 5 mm/hr (ref 0–30)

## 2021-07-09 LAB — URIC ACID: Uric Acid, Serum: 4.3 mg/dL (ref 2.4–7.0)

## 2021-07-09 LAB — VITAMIN D 25 HYDROXY (VIT D DEFICIENCY, FRACTURES): VITD: 60.6 ng/mL (ref 30.00–100.00)

## 2021-07-09 NOTE — Assessment & Plan Note (Signed)
Patient is continuing to have left hip discomfort and groin pain even after the replacement.  We will get x-rays and labs to make sure that everything is fine.  Hopefully everything will be normal and can start increasing activity.  Discussed the possibility of formal physical therapy.  Can also be more lumbar radiculopathy and we will repeat the epidural of the back to for diagnostic and hopefully therapeutic purposes.  Follow-up with me again in 6 to 8 weeks

## 2021-07-09 NOTE — Patient Instructions (Addendum)
Labs today Xray today [facility] [phone] Call Today  See you again 6 weeks after your epidural

## 2021-07-12 LAB — COBALT: Cobalt, Plasma: <0.5 mcg/L (ref <=1.8)

## 2021-07-12 LAB — CHROMIUM LEVEL: Chromium: <0.5 mcg/L (ref <=1.2)

## 2021-07-25 ENCOUNTER — Ambulatory Visit
Admission: RE | Admit: 2021-07-25 | Discharge: 2021-07-25 | Disposition: A | Discharge status: Home / Self Care | Payer: PPO | Source: Ambulatory Visit | Attending: Family Medicine | Admitting: Family Medicine

## 2021-07-25 DIAGNOSIS — M5416 Radiculopathy, lumbar region: Secondary | ICD-10-CM

## 2021-07-25 DIAGNOSIS — M47817 Spondylosis without myelopathy or radiculopathy, lumbosacral region: Secondary | ICD-10-CM | POA: Diagnosis not present

## 2021-07-25 MED ORDER — IOPAMIDOL (ISOVUE-M 200) INJECTION 41%
1.0000 mL | Freq: Once | INTRAMUSCULAR | Status: AC
  Administered 2021-07-25: 1 mL via EPIDURAL

## 2021-07-25 MED ORDER — METHYLPREDNISOLONE ACETATE 40 MG/ML INJ SUSP (RADIOLOG
80.0000 mg | Freq: Once | INTRAMUSCULAR | Status: AC
  Administered 2021-07-25: 80 mg via EPIDURAL

## 2021-07-25 NOTE — Discharge Instructions (Signed)

## 2021-08-13 ENCOUNTER — Other Ambulatory Visit: Payer: Self-pay | Admitting: Obstetrics and Gynecology

## 2021-08-13 DIAGNOSIS — Z1231 Encounter for screening mammogram for malignant neoplasm of breast: Secondary | ICD-10-CM

## 2021-09-04 DIAGNOSIS — Z0142 Encounter for cervical smear to confirm findings of recent normal smear following initial abnormal smear: Secondary | ICD-10-CM | POA: Diagnosis not present

## 2021-09-04 DIAGNOSIS — Z01411 Encounter for gynecological examination (general) (routine) with abnormal findings: Secondary | ICD-10-CM | POA: Diagnosis not present

## 2021-09-04 DIAGNOSIS — Z01419 Encounter for gynecological examination (general) (routine) without abnormal findings: Secondary | ICD-10-CM | POA: Diagnosis not present

## 2021-09-04 DIAGNOSIS — M8580 Other specified disorders of bone density and structure, unspecified site: Secondary | ICD-10-CM | POA: Diagnosis not present

## 2021-09-04 DIAGNOSIS — Z6821 Body mass index (BMI) 21.0-21.9, adult: Secondary | ICD-10-CM | POA: Diagnosis not present

## 2021-09-04 DIAGNOSIS — Z124 Encounter for screening for malignant neoplasm of cervix: Secondary | ICD-10-CM | POA: Diagnosis not present

## 2021-09-16 ENCOUNTER — Ambulatory Visit
Admission: RE | Admit: 2021-09-16 | Discharge: 2021-09-16 | Disposition: A | Discharge status: Home / Self Care | Payer: PPO | Source: Ambulatory Visit | Attending: Obstetrics and Gynecology | Admitting: Obstetrics and Gynecology

## 2021-09-16 DIAGNOSIS — Z1231 Encounter for screening mammogram for malignant neoplasm of breast: Secondary | ICD-10-CM | POA: Diagnosis not present

## 2021-10-02 ENCOUNTER — Ambulatory Visit: Payer: PPO | Admitting: Family Medicine

## 2021-10-02 ENCOUNTER — Encounter: Payer: Self-pay | Admitting: Family Medicine

## 2021-10-02 DIAGNOSIS — M5416 Radiculopathy, lumbar region: Secondary | ICD-10-CM

## 2021-10-02 MED ORDER — DULOXETINE HCL 20 MG PO CPEP: 20.0000 mg | ORAL_CAPSULE | Freq: Every day | ORAL | 0 refills | Status: DC

## 2021-10-02 NOTE — Patient Instructions (Signed)
Cymbalta '20mg'$   Hold on injection Stay active Check back in 1-2 months

## 2021-10-02 NOTE — Assessment & Plan Note (Signed)
Patient did not respond extremely well to the epidural at this time.  Worsening symptoms again.  Has had replacement of the hips bilaterally.  Pain is causing some increase in anxiety as well.  Do feel that Cymbalta could be beneficial.  We will have patient start this.  Warned of potential side effects.  Follow-up with me again in 6 weeks

## 2021-10-02 NOTE — Progress Notes (Signed)
[place] Phone: [phone] Subjective:    I'm seeing this patient by the request  of:  Libby Maw, MD  CC: low back pain   [id number]  07/09/2021 Patient is continuing to have left hip discomfort and groin pain even after the replacement.  We will get x-rays and labs to make sure that everything is fine.  Hopefully everything will be normal and can start increasing activity.  Discussed the possibility of formal physical therapy.  Can also be more lumbar radiculopathy and we will repeat the epidural of the back to for diagnostic and hopefully therapeutic purposes.  Follow-up with me again in 6 to 8 weeks  Update 10/02/2021 Mary [name] is a 66 y.o. female coming in with complaint of lumbar spine pain. Epidural on 07/25/2021. Patient states that the epidural took a while to help her pain. Pain still present in lower L side of back that radiates into L foot. Pain is constant and gets worse as the day goes on. Using Aleve.      Past Medical History:  Diagnosis Date   Arthritis    Complication of anesthesia    Family history of adverse reaction to anesthesia    mother- syncope, N/V   Pneumonia    hx of years ago   PONV (postoperative nausea and vomiting)    Past Surgical History:  Procedure Laterality Date   COLONOSCOPY     HARDWARE REMOVAL Left 11/20/2020   Procedure: LEFT HIP HARDWARE REMOVAL;  Surgeon: Melrose Nakayama, MD;  Location: [facility];  Service: Orthopedics;  Laterality: Left;   HIP FRACTURE SURGERY Left    right hip replacement      Social History   Socioeconomic History   Marital status: Divorced    Spouse name: Not on file   Number of children: Not on file   Years of education: Not on file   Highest education level: Not on file  Occupational History   Not on file  Tobacco Use   Smoking status: Former    Types: Cigarettes    Quit date: 01/20/1986    Years since quitting: 35.7    Smokeless tobacco: Never  Vaping Use   Vaping Use: Never used  Substance and Sexual Activity   Alcohol use: Yes    Alcohol/week: 7.0 standard drinks of alcohol    Types: 7 Glasses of wine per week    Comment: glass wine every day   Drug use: Never   Sexual activity: Not on file  Other Topics Concern   Not on file  Social History Narrative   Not on file   Social Determinants of Health   Financial Resource Strain: Not on file  Food Insecurity: Not on file  Transportation Needs: Not on file  Physical Activity: Not on file  Stress: Not on file  Social Connections: Not on file   No Known Allergies Family History  Problem Relation Age of Onset   Colon cancer Mother    Breast cancer Mother        early 23's   Esophageal cancer Neg Hx    Rectal cancer Neg Hx    Stomach cancer Neg Hx       Current Outpatient Medications (Respiratory):    promethazine (PHENERGAN) 12.5 MG tablet, Take 1-2 tablets (12.5-25 mg total) by mouth every 6 (six) hours as needed for nausea or vomiting.  Current Outpatient Medications (Analgesics):    traMADol (ULTRAM) 50  MG tablet, Take 1-2 tablets (50-100 mg total) by mouth every 6 (six) hours as needed for moderate pain or severe pain (post op pain).   Current Outpatient Medications (Other):    CALCIUM PO, Take 1 tablet by mouth daily.   carbamide peroxide (DEBROX) 6.5 % OTIC solution, Place 5 drops into both ears 2 (two) times daily.   cholecalciferol (VITAMIN D3) 25 MCG (1000 UNIT) tablet, Take 1,000 Units by mouth daily.   DULoxetine (CYMBALTA) 20 MG capsule, Take 1 capsule (20 mg total) by mouth daily.   Multiple Vitamins-Minerals (WOMENS MULTIVITAMIN PO), Take by mouth.   tetrahydrozoline 0.05 % ophthalmic solution, Place 1 drop into both eyes daily as needed (dry eyes).   Reviewed prior external information including notes and imaging from  primary care provider As well as notes that were available from care everywhere and other  healthcare systems.  Past medical history, social, surgical and family history all reviewed in electronic medical record.  No pertanent information unless stated regarding to the chief complaint.   Review of Systems:  No headache, visual changes, nausea, vomiting, diarrhea, constipation, dizziness, abdominal pain, skin rash, fevers, chills, night sweats, weight loss, swollen lymph nodes, joint swelling, chest pain, shortness of breath, mood changes. POSITIVE muscle aches, body aches  Objective  Blood pressure 120/74, pulse 64, height '5\' 2"'$  (1.575 m), weight 121 lb (54.9 kg), SpO2 98 %.   General: No apparent distress alert and oriented x3 mood and affect normal, dressed appropriately.  HEENT: Pupils equal, extraocular movements intact  Respiratory: Patient's speak in full sentences and does not appear short of breath  Cardiovascular: No lower extremity edema, non tender, no erythema  Low back does have loss of lordosis  Patient is sitting relatively comfortably but has some mild difficulty getting out of the chair.  Mild atrophy of the lower extremities.    Impression and Recommendations:    The above documentation has been reviewed and is accurate and complete Lyndal Pulley, DO

## 2021-12-20 NOTE — Progress Notes (Unsigned)
[place] Phone: [phone] Subjective:   Mary Mary [name], am serving as a scribe for Dr. Hulan Saas.  I'm seeing this patient by the request  of:  Libby Maw, MD  CC: Back pain follow-up  [id number]  10/02/2021 Patient did not respond extremely well to the epidural at this time.  Worsening symptoms again.  Has had replacement of the hips bilaterally.  Pain is causing some increase in anxiety as well.  Do feel that Cymbalta could be beneficial.  We will have patient start this.  Warned of potential side effects.  Follow-up with me again in 6 weeks      Update 12/24/2021 Mary Mary [name] is a 66 y.o. female coming in with complaint of lumbar radiculopathy. Patient states that her pain is improving. Patient has less pain near end of the day. Less pain when she rides as well. Pain is located to L glute that radiates down lateral leg to foot.  Would state that it is 60% better with duration and severity.      Past Medical History:  Diagnosis Date   Arthritis    Complication of anesthesia    Family history of adverse reaction to anesthesia    mother- syncope, N/V   Pneumonia    hx of years ago   PONV (postoperative nausea and vomiting)    Past Surgical History:  Procedure Laterality Date   COLONOSCOPY     HARDWARE REMOVAL Left 11/20/2020   Procedure: LEFT HIP HARDWARE REMOVAL;  Surgeon: Melrose Nakayama, MD;  Location: [facility];  Service: Orthopedics;  Laterality: Left;   HIP FRACTURE SURGERY Left    right hip replacement      Social History   Socioeconomic History   Marital status: Divorced    Spouse name: Not on file   Number of children: Not on file   Years of education: Not on file   Highest education level: Not on file  Occupational History   Not on file  Tobacco Use   Smoking status: Former    Types: Cigarettes    Quit date: 01/20/1986    Years since quitting: 35.9   Smokeless  tobacco: Never  Vaping Use   Vaping Use: Never used  Substance and Sexual Activity   Alcohol use: Yes    Alcohol/week: 7.0 standard drinks of alcohol    Types: 7 Glasses of wine per week    Comment: glass wine every day   Drug use: Never   Sexual activity: Not on file  Other Topics Concern   Not on file  Social History Narrative   Not on file   Social Determinants of Health   Financial Resource Strain: Not on file  Food Insecurity: Not on file  Transportation Needs: Not on file  Physical Activity: Not on file  Stress: Not on file  Social Connections: Not on file   Mary [name] Known Allergies Family History  Problem Relation Age of Onset   Colon cancer Mother    Breast cancer Mother        early 24's   Esophageal cancer Neg Hx    Rectal cancer Neg Hx    Stomach cancer Neg Hx          Current Outpatient Medications (Other):    CALCIUM PO, Take 1 tablet by mouth daily.   carbamide peroxide (DEBROX) 6.5 % OTIC solution, Place 5 drops into both ears 2 (two) times daily.  cholecalciferol (VITAMIN D3) 25 MCG (1000 UNIT) tablet, Take 1,000 Units by mouth daily.   gabapentin (NEURONTIN) 100 MG capsule, Take 1 capsule (100 mg total) by mouth 2 (two) times daily.   Multiple Vitamins-Minerals (WOMENS MULTIVITAMIN PO), Take by mouth.   tetrahydrozoline 0.05 % ophthalmic solution, Place 1 drop into both eyes daily as needed (dry eyes).   Reviewed prior external information including notes and imaging from  primary care provider As well as notes that were available from care everywhere and other healthcare systems.  Past medical history, social, surgical and family history all reviewed in electronic medical record.  Mary [name] pertanent information unless stated regarding to the chief complaint.   Review of Systems:  Mary [name] headache, visual changes, nausea, vomiting, diarrhea, constipation, dizziness, abdominal pain, skin rash, fevers, chills, night sweats, weight loss, swollen lymph nodes, body  aches, joint swelling, chest pain, shortness of breath, mood changes. POSITIVE muscle aches  Objective  Blood pressure 110/72, pulse 65, height '5\' 2"'$  (1.575 m), weight 124 lb (56.2 kg), SpO2 97 %.   General: Mary [name] apparent distress alert and oriented x3 mood and affect normal, dressed appropriately.  HEENT: Pupils equal, extraocular movements intact  Respiratory: Patient's speak in full sentences and does not appear short of breath  Cardiovascular: Mary [name] lower extremity edema, non tender, Mary [name] erythema  Low back exam does have some mild loss of lordosis, and tightness with FABER test left greater than right. Negative straight leg test.  5 out of 5 strength in lower extremities.     Impression and Recommendations:     The above documentation has been reviewed and is accurate and complete Mary Pulley, DO

## 2021-12-24 ENCOUNTER — Ambulatory Visit (INDEPENDENT_AMBULATORY_CARE_PROVIDER_SITE_OTHER): Payer: PPO | Admitting: Family Medicine

## 2021-12-24 VITALS — BP 110/72 | HR 65 | Ht 62.0 in | Wt 124.0 lb

## 2021-12-24 DIAGNOSIS — M5416 Radiculopathy, lumbar region: Secondary | ICD-10-CM

## 2021-12-24 MED ORDER — GABAPENTIN 100 MG PO CAPS: 100.0000 mg | ORAL_CAPSULE | Freq: Two times a day (BID) | ORAL | 3 refills | Status: DC

## 2021-12-24 NOTE — Patient Instructions (Signed)
Good to see you  Gabapentin '200mg'$  twice a day. take 0-4 pills at night as needed  Continue to stay active Follow up in 2-3 months

## 2021-12-24 NOTE — Assessment & Plan Note (Addendum)
Patient still has some tightness noted and seems to be more of a lumbar radiculopathy.  We discussed with patient about icing regimen and home exercises.  Discussed the potential for different things such as potential injections.  Patient would like to continue with conservative therapy at this time.  Follow-up again in 6 to 8 weeks.  Gabapentin 200 mg prescribed.  Discussed taking it once or twice daily and see how patient responds and titrate accordingly.

## 2022-02-07 ENCOUNTER — Telehealth: Payer: Self-pay

## 2022-02-07 NOTE — Patient Instructions (Signed)
Visit Information  Thank you for taking time to visit with me today. Please don't hesitate to contact me if I can be of assistance to you.   Following are the goals we discussed today:   Goals Addressed             This Visit's Progress    Care Coordination Activities-No follow up required       Care Coordination Interventions: Advised patient to Annual Wellness exam. Discussed [facility] services and support. Assessed SDOH. Advised to discuss with primary care physician if services needed in the future.         If you are experiencing a Mental Health or [place] or need someone to talk to, please call the Suicide and Crisis Lifeline: 988   Patient verbalizes understanding of instructions and care plan provided today and agrees to view in [place]. Active MyChart status and patient understanding of how to access instructions and care plan via MyChart confirmed with patient.     No further follow up required: decline  Jone Baseman, RN, MSN [place] [facility] Care Management Coordinator Direct Line [phone]

## 2022-02-07 NOTE — Patient Outreach (Signed)
  Care Coordination   Initial Visit Note   02/07/2022 Name: Mary [name] MRN: [id number] DOB: 05-Sep-1955  Mary [name] is a 67 y.o. year old female who sees Libby Maw, MD for primary care. I spoke with  [name] by phone today.  What matters to the patients health and wellness today?  none    Goals Addressed             This Visit's Progress    Care Coordination Activities-No follow up required       Care Coordination Interventions: Advised patient to Annual Wellness exam. Discussed [facility] services and support. Assessed SDOH. Advised to discuss with primary care physician if services needed in the future.         SDOH assessments and interventions completed:  Yes  SDOH Interventions Today    Flowsheet Row Most Recent Value  SDOH Interventions   Food Insecurity Interventions Intervention Not Indicated  Housing Interventions Intervention Not Indicated        Care Coordination Interventions:  Yes, provided   Follow up plan: No further intervention required.   Encounter Outcome:  Pt. Visit Completed   Jone Baseman, RN, MSN [place] [facility] Care Management Coordinator Direct Line [phone]

## 2022-03-26 ENCOUNTER — Telehealth: Payer: Self-pay

## 2022-03-26 NOTE — Telephone Encounter (Signed)
LVM for pt to call the office to schedule [facility] appt. Please transfer to my ext [phone] for scheduling.

## 2022-04-17 ENCOUNTER — Other Ambulatory Visit: Payer: Self-pay | Admitting: Family Medicine

## 2022-04-22 ENCOUNTER — Ambulatory Visit: Payer: PPO

## 2022-04-22 ENCOUNTER — Ambulatory Visit (INDEPENDENT_AMBULATORY_CARE_PROVIDER_SITE_OTHER): Payer: PPO | Admitting: Family Medicine

## 2022-04-22 ENCOUNTER — Encounter: Payer: Self-pay | Admitting: Family Medicine

## 2022-04-22 VITALS — BP 118/80 | HR 61 | Temp 97.0°F | Ht 62.5 in | Wt 122.0 lb

## 2022-04-22 DIAGNOSIS — Z0000 Encounter for general adult medical examination without abnormal findings: Secondary | ICD-10-CM

## 2022-04-22 NOTE — Patient Instructions (Addendum)
Follow up in one year.  Consider obtaining a living will and healthcare power of attorney.  See ophthalmology for an eye check.  Continue female care with GYN provider.    Preventative Services:  AAA screening:  Health Risk Assessment and Personalized Prevention Plan: Bone Mass Measurements: Breast Cancer Screening: CVD Screening:  Cervical Cancer Screening: Colon Cancer Screening:  Depression Screening:  Diabetes Screening:  Glaucoma Screening:  Hepatitis B vaccine: Hepatitis C screening:  HIV Screening: Flu Vaccine: Lung cancer Screening: Obesity Screening:  Pneumonia Vaccines (2): STI Screening:  Follow up plan: No follow-ups on file.   LABORATORY TESTING:  - Pap smear: due  IMMUNIZATIONS:   - Tdap: Tetanus vaccination status reviewed: due. - Influenza: Up to date - Pneumovax: due - Prevnar: due - Zostavax vaccine: requesting records  SCREENING: -Mammogram: Up to date  - Colonoscopy: Up to date  - Bone Density: Up to date  -Hearing Test: Up to date  -Spirometry: Not applicable

## 2022-04-22 NOTE — Progress Notes (Signed)
BP 118/80 (BP Location: Right Arm, Patient Position: Sitting)   Pulse 61   Temp (!) 97 F (36.1 C) (Temporal)   Ht 5' 2.5" (1.588 m)   Wt 122 lb (55.3 kg)   SpO2 98%   BMI 21.96 kg/m    Subjective:    Patient ID: Mary [name], female    DOB: [birth year], 67 y.o.   MRN: [id number]  HPI: Mary [name] is a 67 y.o. female presenting on 04/22/2022 for comprehensive medical examination. Current medical complaints include: Hard time driving at night with glare. Balance is off when it is dark  She currently lives with: Menopausal Symptoms: no  Functional Status Survey: Is the patient deaf or have difficulty hearing?: No Does the patient have difficulty seeing, even when wearing glasses/contacts?: Yes Does the patient have difficulty concentrating, remembering, or making decisions?: No Does the patient have difficulty walking or climbing stairs?: No Does the patient have difficulty dressing or bathing?: No     04/22/2022    3:09 PM 05/30/2020   10:18 AM  [place] in the past year? 0 0  Number falls in past yr: 0   Injury with Fall? 0   Risk for fall due to : No Fall Risks     Depression Screen    04/22/2022    3:06 PM 02/07/2022   10:36 AM 05/30/2020   10:18 AM  Depression screen PHQ 2/9  Decreased Interest 0 0 0  Down, Depressed, Hopeless 0 0 0  PHQ - 2 Score 0 0 0     Advanced Directives Does patient have a HCPOA?    no If yes, name and contact information:  Does patient have a living will or MOST form?  no  Past Medical History:  Past Medical History:  Diagnosis Date   Arthritis    Complication of anesthesia    Family history of adverse reaction to anesthesia    mother- syncope, N/V   Pneumonia    hx of years ago   PONV (postoperative nausea and vomiting)     Surgical History:  Past Surgical History:  Procedure Laterality Date   COLONOSCOPY     HARDWARE REMOVAL Left 11/20/2020   Procedure: LEFT HIP HARDWARE REMOVAL;  Surgeon: Melrose Nakayama,  MD;  Location: [facility];  Service: Orthopedics;  Laterality: Left;   HIP FRACTURE SURGERY Left    right hip replacement       Medications:  Current Outpatient Medications on File Prior to Visit  Medication Sig   CALCIUM PO Take 1 tablet by mouth daily.   carbamide peroxide (DEBROX) 6.5 % OTIC solution Place 5 drops into both ears 2 (two) times daily.   cholecalciferol (VITAMIN D3) 25 MCG (1000 UNIT) tablet Take 1,000 Units by mouth daily.   gabapentin (NEURONTIN) 100 MG capsule TAKE 1 CAPSULE BY MOUTH 2 TIMES DAILY.   Multiple Vitamins-Minerals (WOMENS MULTIVITAMIN PO) Take by mouth.   tetrahydrozoline 0.05 % ophthalmic solution Place 1 drop into both eyes daily as needed (dry eyes). (Patient not taking: Reported on 04/22/2022)   No current facility-administered medications on file prior to visit.    Allergies:  No Known Allergies  Social History:  Social History   Socioeconomic History   Marital status: Divorced    Spouse name: Not on file   Number of children: Not on file   Years of education: Not on file   Highest education level: Not on file  Occupational History   Not  on file  Tobacco Use   Smoking status: Former    Types: Cigarettes    Quit date: 01/20/1986    Years since quitting: 36.2   Smokeless tobacco: Never  Vaping Use   Vaping Use: Never used  Substance and Sexual Activity   Alcohol use: Yes    Alcohol/week: 7.0 standard drinks of alcohol    Types: 7 Glasses of wine per week    Comment: glass wine every day   Drug use: Never   Sexual activity: Not on file  Other Topics Concern   Not on file  Social History Narrative   Not on file   Social Determinants of Health   Financial Resource Strain: Low Risk  (04/22/2022)   Overall Financial Resource Strain (CARDIA)    Difficulty of Paying Living Expenses: Not very hard  Food Insecurity: No Food Insecurity (04/22/2022)   Hunger Vital Sign    Worried About Running Out of Food in the Last Year: Never true    Ran Out  of Food in the Last Year: Never true  Transportation Needs: No Transportation Needs (04/22/2022)   PRAPARE - Hydrologist (Medical): No    Lack of Transportation (Non-Medical): No  Physical Activity: Sufficiently Active (04/22/2022)   Exercise Vital Sign    Days of Exercise per Week: 7 days    Minutes of Exercise per Session: 30 min  Stress: No Stress Concern Present (04/22/2022)   [place]    Feeling of Stress : Not at all  Social Connections: Moderately Isolated (04/22/2022)   Social Connection and Isolation Panel [NHANES]    Frequency of Communication with Friends and Family: More than three times a week    Frequency of Social Gatherings with Friends and Family: Three times a week    Attends Religious Services: Never    Active Member of Clubs or Organizations: Yes    Attends Archivist Meetings: More than 4 times per year    Marital Status: Divorced  Intimate Partner Violence: Not At Risk (04/22/2022)   Humiliation, Afraid, Rape, and Kick questionnaire    Fear of Current or Ex-Partner: No    Emotionally Abused: No    Physically Abused: No    Sexually Abused: No   Social History   Tobacco Use  Smoking Status Former   Types: Cigarettes   Quit date: 01/20/1986   Years since quitting: 36.2  Smokeless Tobacco Never   Social History   Substance and Sexual Activity  Alcohol Use Yes   Alcohol/week: 7.0 standard drinks of alcohol   Types: 7 Glasses of wine per week   Comment: glass wine every day    Family History:  Family History  Problem Relation Age of Onset   Colon cancer Mother    Breast cancer Mother        early 34's   Esophageal cancer Neg Hx    Rectal cancer Neg Hx    Stomach cancer Neg Hx     Past medical history, surgical history, medications, allergies, family history and social history reviewed with patient today and changes made to appropriate areas of the  chart.   ROS  All other ROS negative except what is listed above and in the HPI.      Objective:    BP 118/80 (BP Location: Right Arm, Patient Position: Sitting)   Pulse 61   Temp (!) 97 F (36.1 C) (Temporal)   Ht 5'  2.5" (1.588 m)   Wt 122 lb (55.3 kg)   SpO2 98%   BMI 21.96 kg/m   Wt Readings from Last 3 Encounters:  04/22/22 122 lb (55.3 kg)  12/24/21 124 lb (56.2 kg)  10/02/21 121 lb (54.9 kg)    Hearing Screening   1000Hz  2000Hz  3000Hz  4000Hz  6000Hz  8000Hz   Right ear 15 25 5 20 25 15   Left ear 25 35 15 10 50 20   Vision Screening   Right eye Left eye Both eyes  Without correction 20/50 20/40   With correction       Physical Exam     04/22/2022    3:07 PM  6CIT Screen  What Year? 0 points  What month? 0 points  What time? 0 points  Count back from 20 0 points  Months in reverse 0 points  Repeat phrase 0 points  Total Score 0 points    Cognitive Testing - 6-CIT  Correct? Score   What year is it? yes 0 Yes = 0    No = 4  What month is it? yes 0 Yes = 0    No = 3  Remember:     [address], [place], [place]     What time is it? yes 0 Yes = 0    No = 3  Count backwards from 20 to 1 yes 0 Correct = 0    1 error = 2   More than 1 error = 4  Say the months of the year in reverse. yes 0 Correct = 0    1 error = 2   More than 1 error = 4  What address did I ask you to remember? yes 0 Correct = 0  1 error = 2    2 error = 4    3 error = 6    4 error = 8    All wrong = 10       TOTAL SCORE  0/28   Interpretation:  Normal  Normal (0-7) Abnormal (8-28)   Results for orders placed or performed in visit on 07/09/21  Chromium level  Result Value Ref Range   Chromium <0.5 <=1.2 mcg/L  [place]  Result Value Ref Range   [place], Plasma <0.5 <=1.8 mcg/L  VITAMIN D 25 Hydroxy (Vit-D Deficiency, Fractures)  Result Value Ref Range   VITD 60.60 30.00 - 100.00 ng/mL  Uric acid  Result Value Ref Range   Uric Acid, Serum 4.3 2.4 - 7.0 mg/dL  Sedimentation rate   Result Value Ref Range   Sed Rate 5 0 - 30 mm/hr  Comprehensive metabolic panel  Result Value Ref Range   Sodium 138 135 - 145 mEq/L   Potassium 3.8 3.5 - 5.1 mEq/L   Chloride 103 96 - 112 mEq/L   CO2 27 19 - 32 mEq/L   Glucose, Bld 92 70 - 99 mg/dL   BUN 10 6 - 23 mg/dL   Creatinine, Ser 0.66 0.40 - 1.20 mg/dL   Total Bilirubin 0.4 0.2 - 1.2 mg/dL   Alkaline Phosphatase 52 39 - 117 U/L   AST 18 0 - 37 U/L   ALT 15 0 - 35 U/L   Total Protein 7.0 6.0 - 8.3 g/dL   Albumin 4.4 3.5 - 5.2 g/dL   GFR 92.35 >60.00 mL/min   Calcium 9.4 8.4 - 10.5 mg/dL  CBC with Differential/Platelet  Result Value Ref Range   WBC 6.0 4.0 - 10.5 K/uL  RBC 3.78 (L) 3.87 - 5.11 Mil/uL   Hemoglobin 12.5 12.0 - 15.0 g/dL   HCT 37.6 36.0 - 46.0 %   MCV 99.5 78.0 - 100.0 fl   MCHC 33.3 30.0 - 36.0 g/dL   RDW 13.2 11.5 - 15.5 %   Platelets 238.0 150.0 - 400.0 K/uL   Neutrophils Relative % 55.4 43.0 - 77.0 %   Lymphocytes Relative 35.6 12.0 - 46.0 %   Monocytes Relative 6.8 3.0 - 12.0 %   Eosinophils Relative 1.7 0.0 - 5.0 %   Basophils Relative 0.5 0.0 - 3.0 %   Neutro Abs 3.3 1.4 - 7.7 K/uL   Lymphs Abs 2.1 0.7 - 4.0 K/uL   Monocytes Absolute 0.4 0.1 - 1.0 K/uL   Eosinophils Absolute 0.1 0.0 - 0.7 K/uL   Basophils Absolute 0.0 0.0 - 0.1 K/uL      Assessment & Plan:   Problem List Items Addressed This Visit   None    Preventative Services:  AAA screening:  Health Risk Assessment and Personalized Prevention Plan: Bone Mass Measurements: Breast Cancer Screening: CVD Screening:  Cervical Cancer Screening: Colon Cancer Screening:  Depression Screening:  Diabetes Screening:  Glaucoma Screening:  Hepatitis B vaccine: Hepatitis C screening:  HIV Screening: Flu Vaccine: Lung cancer Screening: Obesity Screening:  Pneumonia Vaccines (2): STI Screening:  Follow up plan: No follow-ups on file.   LABORATORY TESTING:  - Pap smear:  due  IMMUNIZATIONS:   - Tdap: Tetanus vaccination  status reviewed: due. - Influenza: Up to date - Pneumovax:  due - Prevnar:  due - Zostavax vaccine:  requesting records  SCREENING: -Mammogram: Up to date  - Colonoscopy: Up to date  - Bone Density: Up to date  -Hearing Test: Up to date  -Spirometry: Not applicable   PATIENT COUNSELING:   Advised to take 1 mg of folate supplement per day if capable of pregnancy.   Sexuality: Discussed sexually transmitted diseases, partner selection, use of condoms, avoidance of unintended pregnancy  and contraceptive alternatives.   Advised to avoid cigarette smoking.  I discussed with the patient that most people either abstain from alcohol or drink within safe limits (<=14/week and <=4 drinks/occasion for males, <=7/weeks and <= 3 drinks/occasion for females) and that the risk for alcohol disorders and other health effects rises proportionally with the number of drinks per week and how often a drinker exceeds daily limits.  Discussed cessation/primary prevention of drug use and availability of treatment for abuse.   Diet: Encouraged to adjust caloric intake to maintain  or achieve ideal body weight, to reduce intake of dietary saturated fat and total fat, to limit sodium intake by avoiding high sodium foods and not adding table salt, and to maintain adequate dietary potassium and calcium preferably from fresh fruits, vegetables, and low-fat dairy products.    stressed the importance of regular exercise  Injury prevention: Discussed safety belts, safety helmets, smoke detector, smoking near bedding or upholstery.   Dental health: Discussed importance of regular tooth brushing, flossing, and dental visits.    NEXT PREVENTATIVE PHYSICAL DUE IN 1 YEAR. No follow-ups on file.

## 2022-04-22 NOTE — Progress Notes (Deleted)
BP 118/80 (BP Location: Right Arm, Patient Position: Sitting)   Pulse 61   Temp (!) 97 F (36.1 C) (Temporal)   Ht 5' 2.5" (1.588 m)   Wt 122 lb (55.3 kg)   SpO2 98%   BMI 21.96 kg/m    Subjective:    Patient ID: Mary [name], female    DOB: [birth year], 67 y.o.   MRN: [id number]  HPI: Mary [name] is a 67 y.o. female presenting on 04/22/2022 for comprehensive medical examination. Current medical complaints include:{Blank single:19197::"none","***"}  She currently lives with: Menopausal Symptoms: {Blank single:19197::"yes","no"}  Functional Status Survey: Is the patient deaf or have difficulty hearing?: No Does the patient have difficulty seeing, even when wearing glasses/contacts?: Yes Does the patient have difficulty concentrating, remembering, or making decisions?: No Does the patient have difficulty walking or climbing stairs?: No Does the patient have difficulty dressing or bathing?: No     04/22/2022    3:09 PM 05/30/2020   10:18 AM  [place] in the past year? 0 0  Number falls in past yr: 0   Injury with Fall? 0   Risk for fall due to : No Fall Risks     Depression Screen    04/22/2022    3:06 PM 02/07/2022   10:36 AM 05/30/2020   10:18 AM  Depression screen PHQ 2/9  Decreased Interest 0 0 0  Down, Depressed, Hopeless 0 0 0  PHQ - 2 Score 0 0 0     Advanced Directives Does patient have a HCPOA?    {Blank single:19197::"yes","no"} If yes, name and contact information:  Does patient have a living will or MOST form?  {Blank single:19197::"yes","no"}  Past Medical History:  Past Medical History:  Diagnosis Date   Arthritis    Complication of anesthesia    Family history of adverse reaction to anesthesia    mother- syncope, N/V   Pneumonia    hx of years ago   PONV (postoperative nausea and vomiting)     Surgical History:  Past Surgical History:  Procedure Laterality Date   COLONOSCOPY     HARDWARE REMOVAL Left 11/20/2020   Procedure:  LEFT HIP HARDWARE REMOVAL;  Surgeon: Melrose Nakayama, MD;  Location: [facility];  Service: Orthopedics;  Laterality: Left;   HIP FRACTURE SURGERY Left    right hip replacement       Medications:  Current Outpatient Medications on File Prior to Visit  Medication Sig   CALCIUM PO Take 1 tablet by mouth daily.   carbamide peroxide (DEBROX) 6.5 % OTIC solution Place 5 drops into both ears 2 (two) times daily.   cholecalciferol (VITAMIN D3) 25 MCG (1000 UNIT) tablet Take 1,000 Units by mouth daily.   gabapentin (NEURONTIN) 100 MG capsule TAKE 1 CAPSULE BY MOUTH 2 TIMES DAILY.   Multiple Vitamins-Minerals (WOMENS MULTIVITAMIN PO) Take by mouth.   tetrahydrozoline 0.05 % ophthalmic solution Place 1 drop into both eyes daily as needed (dry eyes). (Patient not taking: Reported on 04/22/2022)   No current facility-administered medications on file prior to visit.    Allergies:  No Known Allergies  Social History:  Social History   Socioeconomic History   Marital status: Divorced    Spouse name: Not on file   Number of children: Not on file   Years of education: Not on file   Highest education level: Not on file  Occupational History   Not on file  Tobacco Use   Smoking status: Former  Types: Cigarettes    Quit date: 01/20/1986    Years since quitting: 36.2   Smokeless tobacco: Never  Vaping Use   Vaping Use: Never used  Substance and Sexual Activity   Alcohol use: Yes    Alcohol/week: 7.0 standard drinks of alcohol    Types: 7 Glasses of wine per week    Comment: glass wine every day   Drug use: Never   Sexual activity: Not on file  Other Topics Concern   Not on file  Social History Narrative   Not on file   Social Determinants of Health   Financial Resource Strain: Low Risk  (04/22/2022)   Overall Financial Resource Strain (CARDIA)    Difficulty of Paying Living Expenses: Not very hard  Food Insecurity: No Food Insecurity (04/22/2022)   Hunger Vital Sign    Worried About  Running Out of Food in the Last Year: Never true    Ran Out of Food in the Last Year: Never true  Transportation Needs: No Transportation Needs (04/22/2022)   PRAPARE - Hydrologist (Medical): No    Lack of Transportation (Non-Medical): No  Physical Activity: Sufficiently Active (04/22/2022)   Exercise Vital Sign    Days of Exercise per Week: 7 days    Minutes of Exercise per Session: 30 min  Stress: No Stress Concern Present (04/22/2022)   [place]    Feeling of Stress : Not at all  Social Connections: Moderately Isolated (04/22/2022)   Social Connection and Isolation Panel [NHANES]    Frequency of Communication with Friends and Family: More than three times a week    Frequency of Social Gatherings with Friends and Family: Three times a week    Attends Religious Services: Never    Active Member of Clubs or Organizations: Yes    Attends Archivist Meetings: More than 4 times per year    Marital Status: Divorced  Intimate Partner Violence: Not At Risk (04/22/2022)   Humiliation, Afraid, Rape, and Kick questionnaire    Fear of Current or Ex-Partner: No    Emotionally Abused: No    Physically Abused: No    Sexually Abused: No   Social History   Tobacco Use  Smoking Status Former   Types: Cigarettes   Quit date: 01/20/1986   Years since quitting: 36.2  Smokeless Tobacco Never   Social History   Substance and Sexual Activity  Alcohol Use Yes   Alcohol/week: 7.0 standard drinks of alcohol   Types: 7 Glasses of wine per week   Comment: glass wine every day    Family History:  Family History  Problem Relation Age of Onset   Colon cancer Mother    Breast cancer Mother        early 56's   Esophageal cancer Neg Hx    Rectal cancer Neg Hx    Stomach cancer Neg Hx     Past medical history, surgical history, medications, allergies, family history and social history reviewed with  patient today and changes made to appropriate areas of the chart.   ROS  All other ROS negative except what is listed above and in the HPI.      Objective:    BP 118/80 (BP Location: Right Arm, Patient Position: Sitting)   Pulse 61   Temp (!) 97 F (36.1 C) (Temporal)   Ht 5' 2.5" (1.588 m)   Wt 122 lb (55.3 kg)   SpO2  98%   BMI 21.96 kg/m   Wt Readings from Last 3 Encounters:  04/22/22 122 lb (55.3 kg)  12/24/21 124 lb (56.2 kg)  10/02/21 121 lb (54.9 kg)    Hearing Screening   1000Hz  2000Hz  3000Hz  4000Hz  6000Hz  8000Hz   Right ear 15 25 5 20 25 15   Left ear 25 35 15 10 50 20   Vision Screening   Right eye Left eye Both eyes  Without correction 20/50 20/40   With correction       Physical Exam     04/22/2022    3:07 PM  6CIT Screen  What Year? 0 points  What month? 0 points  What time? 0 points  Count back from 20 0 points  Months in reverse 0 points  Repeat phrase 0 points  Total Score 0 points    Cognitive Testing - 6-CIT  Correct? Score   What year is it? {YES NO:22349} {Numbers; 0-4:31231} Yes = 0    No = 4  What month is it? {YES NO:22349} {Numbers; 0-4:31231} Yes = 0    No = 3  Remember:     [address], [place], [place]     What time is it? {YES NO:22349} {Numbers; 0-4:31231} Yes = 0    No = 3  Count backwards from 20 to 1 {YES NO:22349} {Numbers; 0-4:31231} Correct = 0    1 error = 2   More than 1 error = 4  Say the months of the year in reverse. {YES NO:22349} {Numbers; 0-4:31231} Correct = 0    1 error = 2   More than 1 error = 4  What address did I ask you to remember? {YES NO:22349} {NUMBERS; 0-10:5044} Correct = 0  1 error = 2    2 error = 4    3 error = 6    4 error = 8    All wrong = 10       TOTAL SCORE  {Numbers; [id number]   Interpretation:  {Desc; normal/abnormal:11317::"Normal"}  Normal (0-7) Abnormal (8-28)   Results for orders placed or performed in visit on 07/09/21  Chromium level  Result Value Ref Range   Chromium <0.5  <=1.2 mcg/L  [place]  Result Value Ref Range   [place], Plasma <0.5 <=1.8 mcg/L  VITAMIN D 25 Hydroxy (Vit-D Deficiency, Fractures)  Result Value Ref Range   VITD 60.60 30.00 - 100.00 ng/mL  Uric acid  Result Value Ref Range   Uric Acid, Serum 4.3 2.4 - 7.0 mg/dL  Sedimentation rate  Result Value Ref Range   Sed Rate 5 0 - 30 mm/hr  Comprehensive metabolic panel  Result Value Ref Range   Sodium 138 135 - 145 mEq/L   Potassium 3.8 3.5 - 5.1 mEq/L   Chloride 103 96 - 112 mEq/L   CO2 27 19 - 32 mEq/L   Glucose, Bld 92 70 - 99 mg/dL   BUN 10 6 - 23 mg/dL   Creatinine, Ser 0.66 0.40 - 1.20 mg/dL   Total Bilirubin 0.4 0.2 - 1.2 mg/dL   Alkaline Phosphatase 52 39 - 117 U/L   AST 18 0 - 37 U/L   ALT 15 0 - 35 U/L   Total Protein 7.0 6.0 - 8.3 g/dL   Albumin 4.4 3.5 - 5.2 g/dL   GFR 92.35 >60.00 mL/min   Calcium 9.4 8.4 - 10.5 mg/dL  CBC with Differential/Platelet  Result Value Ref Range   WBC 6.0 4.0 - 10.5  K/uL   RBC 3.78 (L) 3.87 - 5.11 Mil/uL   Hemoglobin 12.5 12.0 - 15.0 g/dL   HCT 37.6 36.0 - 46.0 %   MCV 99.5 78.0 - 100.0 fl   MCHC 33.3 30.0 - 36.0 g/dL   RDW 13.2 11.5 - 15.5 %   Platelets 238.0 150.0 - 400.0 K/uL   Neutrophils Relative % 55.4 43.0 - 77.0 %   Lymphocytes Relative 35.6 12.0 - 46.0 %   Monocytes Relative 6.8 3.0 - 12.0 %   Eosinophils Relative 1.7 0.0 - 5.0 %   Basophils Relative 0.5 0.0 - 3.0 %   Neutro Abs 3.3 1.4 - 7.7 K/uL   Lymphs Abs 2.1 0.7 - 4.0 K/uL   Monocytes Absolute 0.4 0.1 - 1.0 K/uL   Eosinophils Absolute 0.1 0.0 - 0.7 K/uL   Basophils Absolute 0.0 0.0 - 0.1 K/uL      Assessment & Plan:   Problem List Items Addressed This Visit   None    Preventative Services:  AAA screening:  Health Risk Assessment and Personalized Prevention Plan: Bone Mass Measurements: Breast Cancer Screening: CVD Screening:  Cervical Cancer Screening: Colon Cancer Screening:  Depression Screening:  Diabetes Screening:  Glaucoma Screening:  Hepatitis B  vaccine: Hepatitis C screening:  HIV Screening: Flu Vaccine: Lung cancer Screening: Obesity Screening:  Pneumonia Vaccines (2): STI Screening:  Follow up plan: No follow-ups on file.   LABORATORY TESTING:  - Pap smear: {Blank [id number] done","not applicable","up to date","done elsewhere"}  IMMUNIZATIONS:   - Tdap: Tetanus vaccination status reviewed: {tetanus status:315746}. - Influenza: Up to date - Pneumovax: {Blank single:19197::"Up to date","Administered today","Not applicable","Refused","Given elsewhere"} - Prevnar: {Blank single:19197::"Up to date","Administered today","Not applicable","Refused","Given elsewhere"} - Zostavax vaccine: Up to date  SCREENING: -Mammogram: Up to date  - Colonoscopy: Up to date  - Bone Density: Up to date  -Hearing Test: Up to date  -Spirometry: Not applicable   PATIENT COUNSELING:   Advised to take 1 mg of folate supplement per day if capable of pregnancy.   Sexuality: Discussed sexually transmitted diseases, partner selection, use of condoms, avoidance of unintended pregnancy  and contraceptive alternatives.   Advised to avoid cigarette smoking.  I discussed with the patient that most people either abstain from alcohol or drink within safe limits (<=14/week and <=4 drinks/occasion for males, <=7/weeks and <= 3 drinks/occasion for females) and that the risk for alcohol disorders and other health effects rises proportionally with the number of drinks per week and how often a drinker exceeds daily limits.  Discussed cessation/primary prevention of drug use and availability of treatment for abuse.   Diet: Encouraged to adjust caloric intake to maintain  or achieve ideal body weight, to reduce intake of dietary saturated fat and total fat, to limit sodium intake by avoiding high sodium foods and not adding table salt, and to maintain adequate dietary potassium and calcium preferably from fresh fruits, vegetables, and low-fat dairy  products.    stressed the importance of regular exercise  Injury prevention: Discussed safety belts, safety helmets, smoke detector, smoking near bedding or upholstery.   Dental health: Discussed importance of regular tooth brushing, flossing, and dental visits.    NEXT PREVENTATIVE PHYSICAL DUE IN 1 YEAR. No follow-ups on file.

## 2022-06-29 IMAGING — MR MR HIP*R* W/O CM
5 of 6 series · 33 of 40 positions shown · non-contrast
Comparison: Plain films right hip 05/16/2020.

CLINICAL DATA: Right hip pain for 3 weeks.

EXAM:
MR OF THE RIGHT HIP WITHOUT CONTRAST
TECHNIQUE: Multiplanar, multisequence MR imaging was performed. No intravenous
contrast was administered.

[Series 3: T1 · coronal · 4.0mm · 1.19mm/px · 8 of 31 slices shown]
[im 1/31]
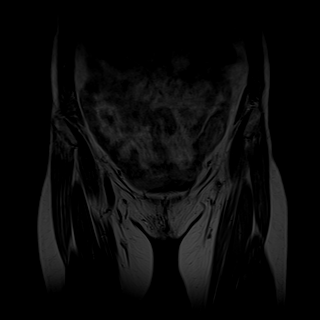
[im 5/31]
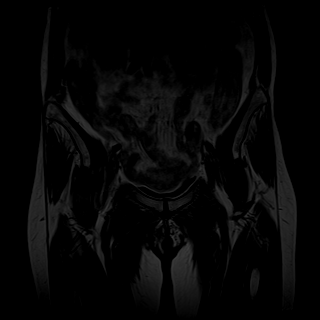
[im 9/31]
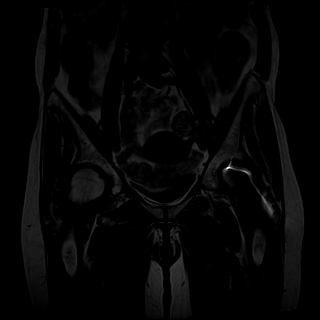
[im 13/31]
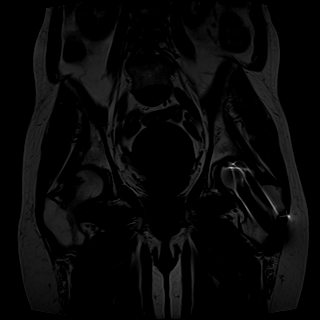
[im 18/31]
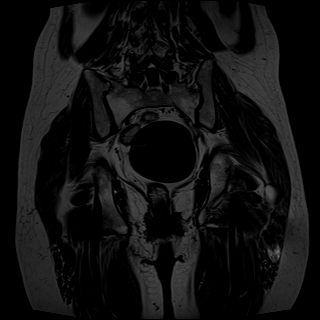
[im 22/31]
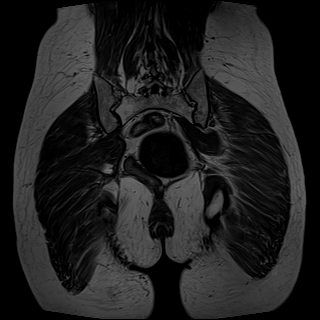
[im 26/31]
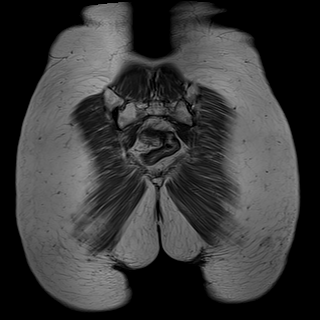
[im 31/31]
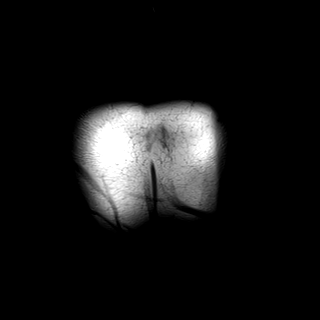

[Series 4: T2 fat-sat · coronal · 4.0mm · 1.19mm/px · 7 of 31 slices shown (1 of 2)]
[im 1/31]
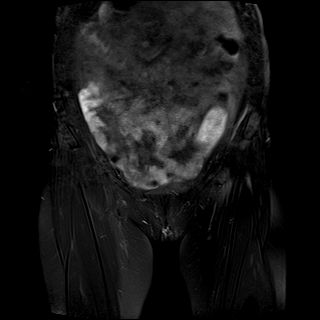
[im 6/31]
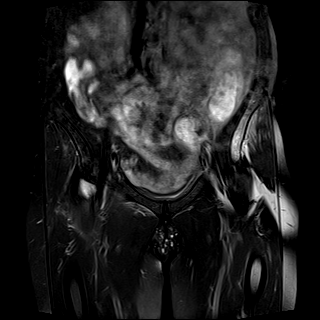
[im 11/31]
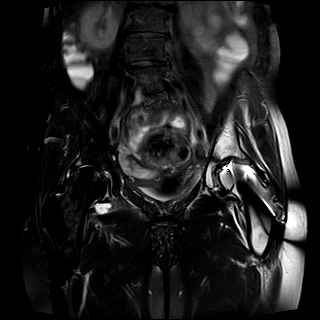
[im 16/31]
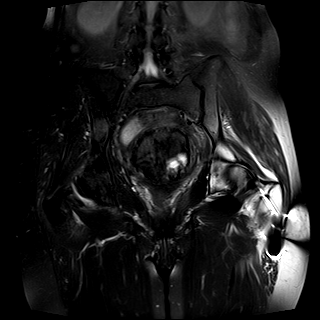
[im 21/31]
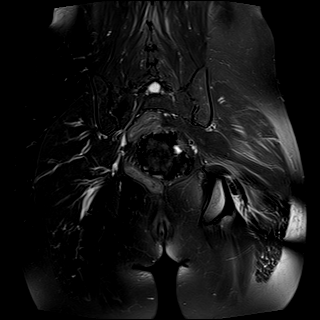
[im 26/31]
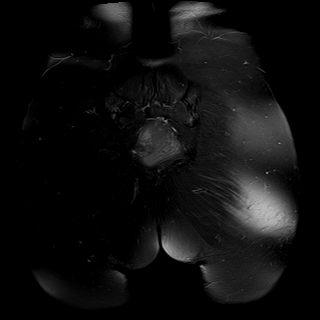
[im 31/31]
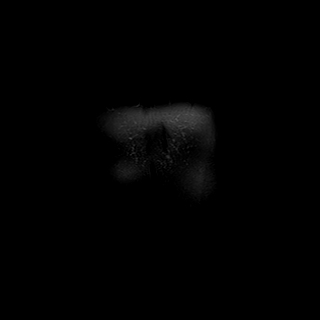

[Series 5: T2 fat-sat · axial · 4.0mm · 0.62mm/px · z∈[-129,+20]mm · 8 of 32 slices shown (2 of 2)]
[im 1/32]
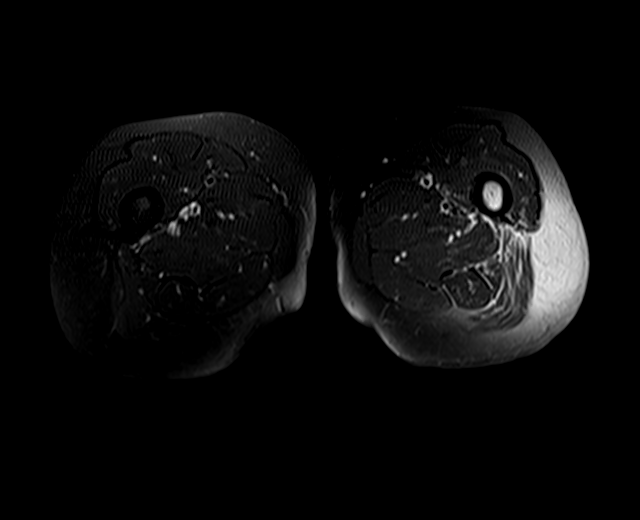
[im 5/32]
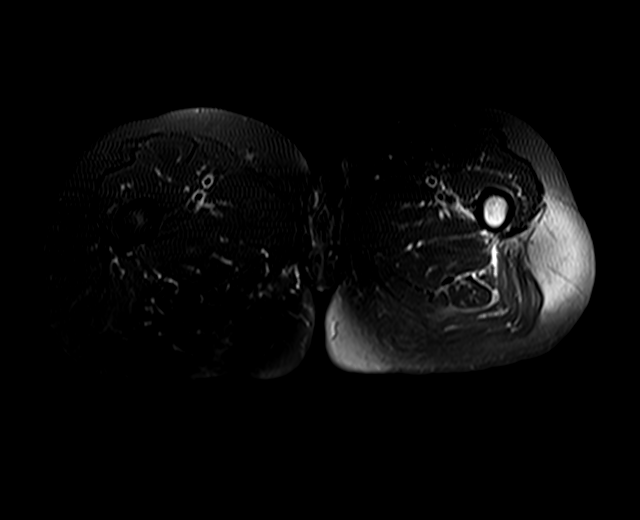
[im 9/32]
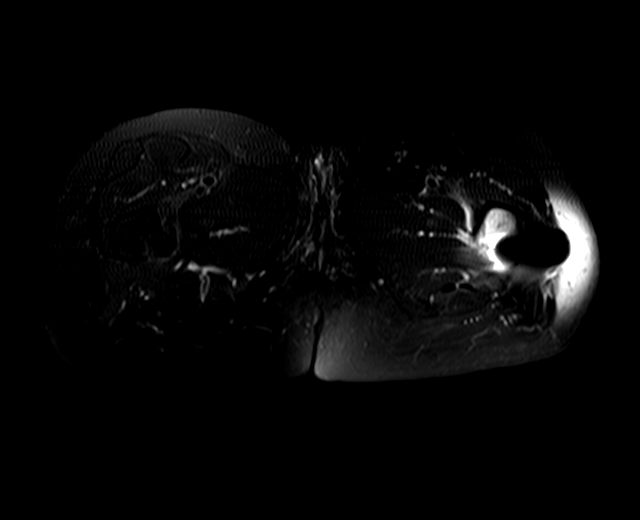
[im 14/32]
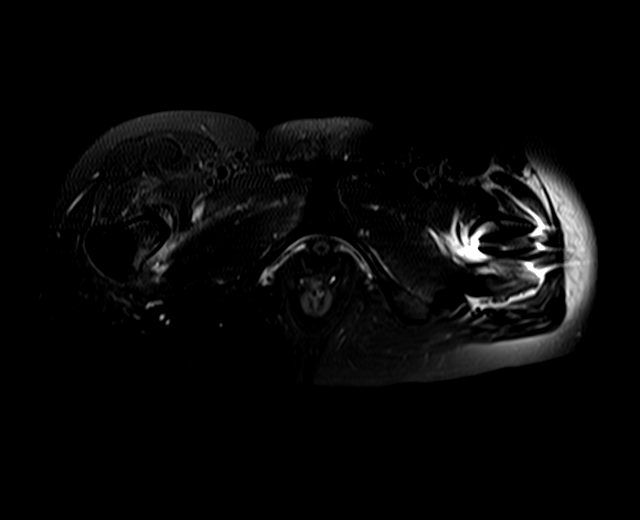
[im 18/32]
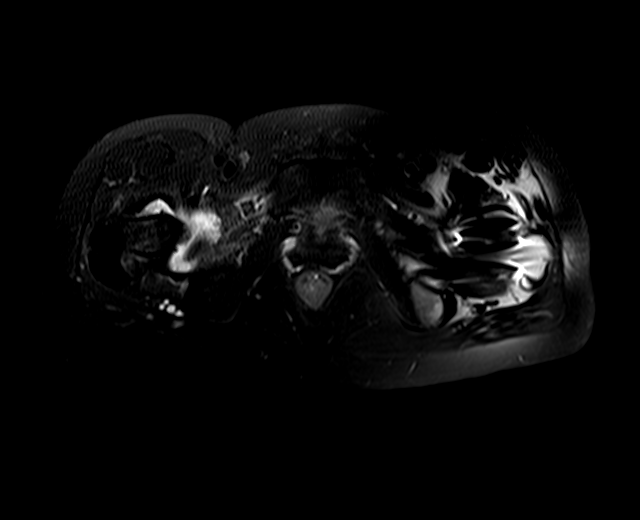
[im 23/32]
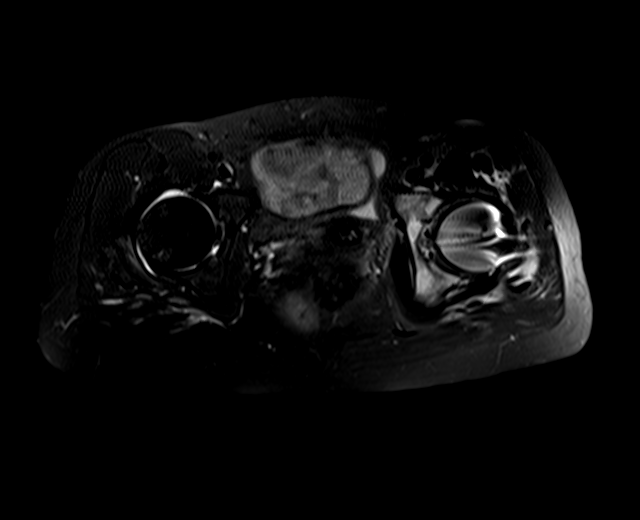
[im 27/32]
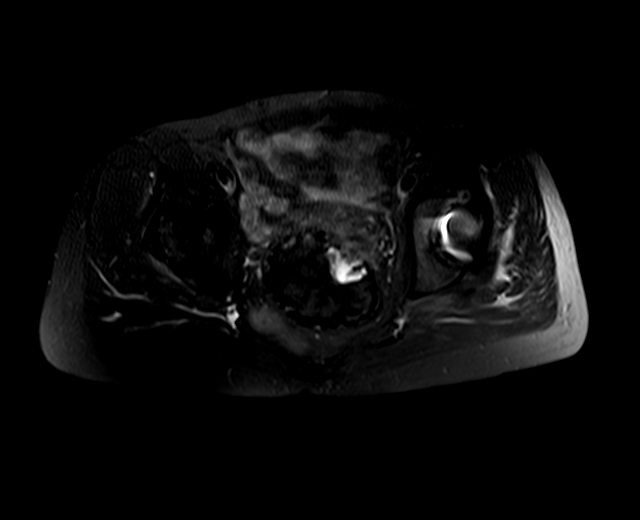
[im 32/32]
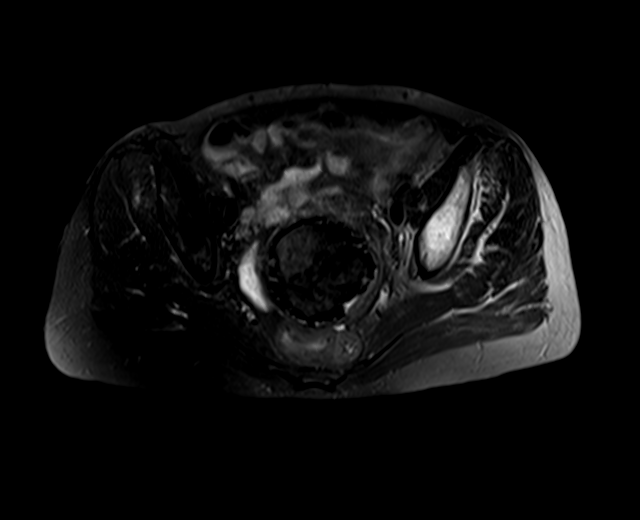

[Series 6: PD fat-sat · sagittal · 4.0mm · 0.70mm/px · 6 of 26 slices shown (1 of 2)]
[im 1/26]
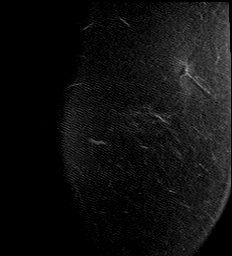
[im 6/26]
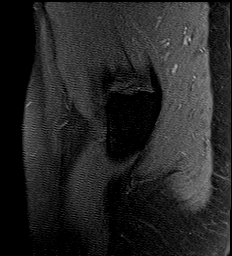
[im 11/26]
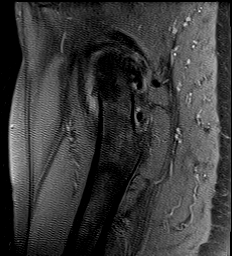
[im 16/26]
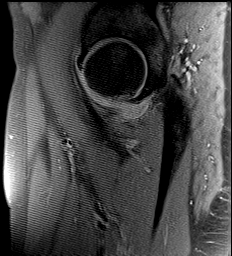
[im 21/26]
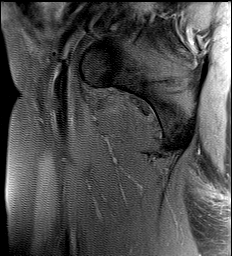
[im 26/26]
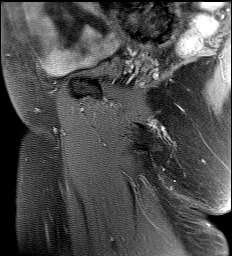

[Series 7: PD fat-sat · coronal · 4.0mm · 0.70mm/px · 4 of 19 slices shown (2 of 2)]
[im 1/19]
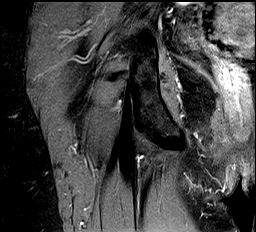
[im 7/19]
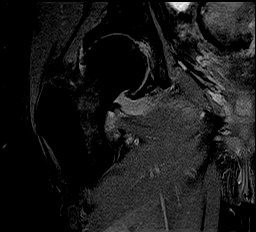
[im 13/19]
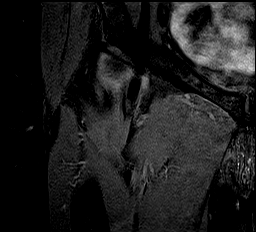
[im 19/19]
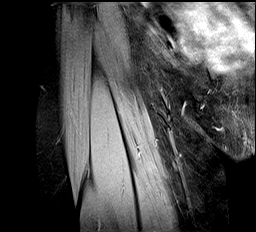

[33 of 40 positions shown; findings below may reference images not displayed]

FINDINGS: Bones: There is no acute bony or joint abnormality. Osteophytosis
and subchondral edema are seen about the right hip. There is mild
marrow edema in the right femoral neck consistent with stress change
secondary to degenerative disease. Artifact from fixation hardware
for left subcapital hip fracture noted. No worrisome bony lesion.
Convex right scoliosis and degenerative change are incompletely
evaluated on this study.

Articular cartilage and labrum

Articular cartilage: Severely thinned with associated joint space
narrowing.

Labrum: The labrum is markedly degenerated. Tearing is best seen
along the anterior labrum.

Joint or bursal effusion

Joint effusion:  Small right hip joint effusion.

Bursae: Negative.

Muscles and tendons

Muscles and tendons:  Intact.

Other findings

Miscellaneous: Imaged intrapelvic contents demonstrate a calcified
uterine fibroid measuring 7.1 cm as seen on the prior plain film.
IMPRESSION: No acute abnormality.  Negative for fracture.

Advanced right hip osteoarthritis with associated degeneration
tearing of the right labrum. Very mild marrow edema in the right
femoral neck is compatible with stress change secondary to
osteoarthritis.

Status post fixation of a left hip fracture.

Convex right lumbar scoliosis and degenerative disease are
incompletely evaluated.

Calcified uterine fibroid.

## 2022-07-28 ENCOUNTER — Other Ambulatory Visit: Payer: Self-pay | Admitting: Family Medicine

## 2022-08-26 ENCOUNTER — Other Ambulatory Visit: Payer: Self-pay | Admitting: Obstetrics and Gynecology

## 2022-08-26 DIAGNOSIS — Z1231 Encounter for screening mammogram for malignant neoplasm of breast: Secondary | ICD-10-CM

## 2022-09-25 ENCOUNTER — Ambulatory Visit: Payer: PPO

## 2022-10-15 ENCOUNTER — Ambulatory Visit: Payer: PPO

## 2022-11-26 ENCOUNTER — Ambulatory Visit: Payer: PPO

## 2022-11-28 ENCOUNTER — Other Ambulatory Visit: Payer: Self-pay | Admitting: Family Medicine

## 2022-11-28 ENCOUNTER — Other Ambulatory Visit: Payer: Self-pay

## 2022-11-28 MED ORDER — GABAPENTIN 100 MG PO CAPS: 100.0000 mg | ORAL_CAPSULE | Freq: Two times a day (BID) | ORAL | 0 refills | Status: DC

## 2022-12-16 ENCOUNTER — Ambulatory Visit
Admission: RE | Admit: 2022-12-16 | Discharge: 2022-12-16 | Disposition: A | Discharge status: Home / Self Care | Payer: PPO | Source: Ambulatory Visit | Attending: Obstetrics and Gynecology | Admitting: Obstetrics and Gynecology

## 2022-12-16 DIAGNOSIS — Z1231 Encounter for screening mammogram for malignant neoplasm of breast: Secondary | ICD-10-CM | POA: Diagnosis not present

## 2023-01-24 ENCOUNTER — Other Ambulatory Visit: Payer: Self-pay | Admitting: Family Medicine

## 2023-02-25 NOTE — Progress Notes (Signed)
 [name] [address] [place] [address] Phone: [phone] Subjective:   LILLETTE [name], am serving as a scribe for Dr. Arthea Claudene.  I'm seeing this patient by the request  of:  Berneta Elsie Sayre, MD  CC: Low back pain  YEP:[id number]  12/24/2021 Patient still has some tightness noted and seems to be more of a lumbar radiculopathy.  We discussed with patient about icing regimen and home exercises.  Discussed the potential for different things such as potential injections.  Patient would like to continue with conservative therapy at this time.  Follow-up again in 6 to 8 weeks.  Gabapentin  200 mg prescribed.  Discussed taking it once or twice daily and see how patient responds and titrate accordingly.      Update 02/26/2023 Mary [name] is a 68 y.o. female coming in with complaint of lumbar spine pain. Patient states that she has radiating symptoms down the L leg. Constant pain in lumbar spine that is exacerbated by riding and barn work. Gabapentin  at night and naproxen once daily. Tried gabapentin  during the day and this was not helpful.   Pain also in plantar aspect of R heel for past month. Painful to walk.        Past Medical History:  Diagnosis Date   Arthritis    Complication of anesthesia    Family history of adverse reaction to anesthesia    mother- syncope, N/V   Pneumonia    hx of years ago   PONV (postoperative nausea and vomiting)    Past Surgical History:  Procedure Laterality Date   COLONOSCOPY     HARDWARE REMOVAL Left 11/20/2020   Procedure: LEFT HIP HARDWARE REMOVAL;  Surgeon: Sheril Coy, MD;  Location: [facility];  Service: Orthopedics;  Laterality: Left;   HIP FRACTURE SURGERY Left    right hip replacement      Social History   Socioeconomic History   Marital status: Divorced    Spouse name: Not on file   Number of children: Not on file   Years of education: Not on file   Highest education level:  Not on file  Occupational History   Not on file  Tobacco Use   Smoking status: Former    Current packs/day: 0.00    Types: Cigarettes    Quit date: 01/20/1986    Years since quitting: 37.1   Smokeless tobacco: Never  Vaping Use   Vaping status: Never Used  Substance and Sexual Activity   Alcohol use: Yes    Alcohol/week: 7.0 standard drinks of alcohol    Types: 7 Glasses of wine per week    Comment: glass wine every day   Drug use: Never   Sexual activity: Not on file  Other Topics Concern   Not on file  Social History Narrative   Not on file   Social Drivers of Health   Financial Resource Strain: Low Risk  (04/22/2022)   Overall Financial Resource Strain (CARDIA)    Difficulty of Paying Living Expenses: Not very hard  Food Insecurity: No Food Insecurity (04/22/2022)   Hunger Vital Sign    Worried About Running Out of Food in the Last Year: Never true    Ran Out of Food in the Last Year: Never true  Transportation Needs: No Transportation Needs (04/22/2022)   PRAPARE - Administrator, Civil Service (Medical): No    Lack of Transportation (Non-Medical): No  Physical Activity: Sufficiently Active (04/22/2022)  Exercise Vital Sign    Days of Exercise per Week: 7 days    Minutes of Exercise per Session: 30 min  Stress: No Stress Concern Present (04/22/2022)   [employer] of Occupational Health - Occupational Stress Questionnaire    Feeling of Stress : Not at all  Social Connections: Moderately Isolated (04/22/2022)   Social Connection and Isolation Panel [NHANES]    Frequency of Communication with Friends and Family: More than three times a week    Frequency of Social Gatherings with Friends and Family: Three times a week    Attends Religious Services: Never    Active Member of Clubs or Organizations: Yes    Attends Engineer, Structural: More than 4 times per year    Marital Status: Divorced   No Known Allergies Family History  Problem Relation Age of  Onset   Colon cancer Mother    Breast cancer Mother        early 88's   Esophageal cancer Neg Hx    Rectal cancer Neg Hx    Stomach cancer Neg Hx          Current Outpatient Medications (Other):    CALCIUM PO, Take 1 tablet by mouth daily.   carbamide peroxide (DEBROX) 6.5 % OTIC solution, Place 5 drops into both ears 2 (two) times daily.   cholecalciferol (VITAMIN D3) 25 MCG (1000 UNIT) tablet, Take 1,000 Units by mouth daily.   gabapentin  (NEURONTIN ) 100 MG capsule, TAKE 1 CAPSULE BY MOUTH 2 TIMES DAILY.   Multiple Vitamins-Minerals (WOMENS MULTIVITAMIN PO), Take by mouth.   tetrahydrozoline 0.05 % ophthalmic solution, Place 1 drop into both eyes daily as needed (dry eyes).   Reviewed prior external information including notes and imaging from  primary care provider As well as notes that were available from care everywhere and other healthcare systems.  Past medical history, social, surgical and family history all reviewed in electronic medical record.  No pertanent information unless stated regarding to the chief complaint.   Review of Systems:  No headache, visual changes, nausea, vomiting, diarrhea, constipation, dizziness, abdominal pain, skin rash, fevers, chills, night sweats, weight loss, swollen lymph nodes, body aches, joint swelling, chest pain, shortness of breath, mood changes. POSITIVE muscle aches  Objective  Blood pressure 128/80, pulse (!) 54, height 5' 2.5 (1.588 m), weight 121 lb (54.9 kg), SpO2 98%.   General: No apparent distress alert and oriented x3 mood and affect normal, dressed appropriately.  HEENT: Pupils equal, extraocular movements intact  Respiratory: Patient's speak in full sentences and does not appear short of breath  Cardiovascular: No lower extremity edema, non tender, no erythema  Patient low back does have some loss lordosis noted.  Tightness noted in the paraspinal musculature.  Mild positive straight leg test noted.  Patient's hips are  fairly unremarkable.  Only 5 degrees of extension noted.  Foot exam shows the patient does have tenderness to palpation over the medial calcaneal area.  Patient does have a high arch noted.  Mild breakdown of the transverse arch noted.   [id number]; 15 additional minutes spent for Therapeutic exercises as stated in above notes.  This included exercises focusing on stretching, strengthening, with significant focus on eccentric aspects.   Long term goals include an improvement in range of motion, strength, endurance as well as avoiding reinjury. Patient's frequency would include in 1-2 times a day, 3-5 times a week for a duration of 6-12 weeks. Exercises for the foot include:  Stretches to help  lengthen the lower leg and plantar fascia areas Theraband exercises for the lower leg and ankle to help strengthen the surrounding area- dorsiflexion, plantarflexion, inversion, eversion Massage rolling on the plantar surface of the foot with a frozen bottle, tennis ball or golf ball Towel or marble pick-ups to strengthen the plantar surface of the foot Weight bearing exercises to increase balance and overall stability   Proper technique shown and discussed handout in great detail with ATC.  All questions were discussed and answered.     Impression and Recommendations:    The above documentation has been reviewed and is accurate and complete Ethne Jeon M Hernandez [place], DO

## 2023-02-26 ENCOUNTER — Encounter: Payer: Self-pay | Admitting: Family Medicine

## 2023-02-26 ENCOUNTER — Ambulatory Visit: Payer: PPO

## 2023-02-26 ENCOUNTER — Ambulatory Visit: Payer: PPO | Admitting: Family Medicine

## 2023-02-26 VITALS — BP 128/80 | HR 54 | Ht 62.5 in | Wt 121.0 lb

## 2023-02-26 DIAGNOSIS — M545 Low back pain, unspecified: Secondary | ICD-10-CM

## 2023-02-26 DIAGNOSIS — M48061 Spinal stenosis, lumbar region without neurogenic claudication: Secondary | ICD-10-CM | POA: Diagnosis not present

## 2023-02-26 DIAGNOSIS — M5416 Radiculopathy, lumbar region: Secondary | ICD-10-CM | POA: Diagnosis not present

## 2023-02-26 DIAGNOSIS — M25552 Pain in left hip: Secondary | ICD-10-CM | POA: Diagnosis not present

## 2023-02-26 DIAGNOSIS — Z96643 Presence of artificial hip joint, bilateral: Secondary | ICD-10-CM | POA: Diagnosis not present

## 2023-02-26 DIAGNOSIS — M47816 Spondylosis without myelopathy or radiculopathy, lumbar region: Secondary | ICD-10-CM | POA: Diagnosis not present

## 2023-02-26 DIAGNOSIS — M722 Plantar fascial fibromatosis: Secondary | ICD-10-CM | POA: Diagnosis not present

## 2023-02-26 DIAGNOSIS — M4316 Spondylolisthesis, lumbar region: Secondary | ICD-10-CM | POA: Diagnosis not present

## 2023-02-26 NOTE — Assessment & Plan Note (Signed)
 Do believe the patient's pain is likely more secondary to the back again.  Has responded relatively well to an epidural previously in the back and do want a repeated.  Will get x-rays to further evaluate to see if any other bony abnormalities could be potentially contributing.  Discussed icing regimen and home exercises, increase activity slowly otherwise.  Follow-up with me again 6 weeks after the epidural to see how patient is responding

## 2023-02-26 NOTE — Assessment & Plan Note (Signed)
 We reviewed that stretching is critically important to the treatment of PF.  Reviewed footwear. Rigid soles have been shown to help with PF. Night splints can sometimes help. Reviewed rehab of stretching and calf raises.   Could benefit from a corticosteroid injection, orthotics, or other measures if conservative treatment fails. Worsening pain consider formal physical therapy or injections.

## 2023-02-26 NOTE — Patient Instructions (Signed)
 Epidural [phone] Xray today Plantar fasciitis exercises OOFOS or HOKA recovery sandals in the house Rigid soled shoes See me in 2 months

## 2023-03-04 NOTE — Discharge Instructions (Signed)

## 2023-03-05 ENCOUNTER — Ambulatory Visit
Admission: RE | Admit: 2023-03-05 | Discharge: 2023-03-05 | Disposition: A | Discharge status: Home / Self Care | Payer: PPO | Source: Ambulatory Visit | Attending: Family Medicine | Admitting: Family Medicine

## 2023-03-05 DIAGNOSIS — M545 Low back pain, unspecified: Secondary | ICD-10-CM

## 2023-03-05 DIAGNOSIS — M4727 Other spondylosis with radiculopathy, lumbosacral region: Secondary | ICD-10-CM | POA: Diagnosis not present

## 2023-03-05 MED ORDER — IOPAMIDOL (ISOVUE-M 200) INJECTION 41%
1.0000 mL | Freq: Once | INTRAMUSCULAR | Status: AC
  Administered 2023-03-05: 1 mL via EPIDURAL

## 2023-03-05 MED ORDER — METHYLPREDNISOLONE ACETATE 40 MG/ML INJ SUSP (RADIOLOG
80.0000 mg | Freq: Once | INTRAMUSCULAR | Status: AC
  Administered 2023-03-05: 80 mg via EPIDURAL

## 2023-03-12 ENCOUNTER — Encounter: Payer: Self-pay | Admitting: Family Medicine

## 2023-03-27 ENCOUNTER — Other Ambulatory Visit: Payer: Self-pay | Admitting: Family Medicine

## 2023-04-28 NOTE — Progress Notes (Unsigned)
 [facility] [address] [place] [address] Phone: [phone] Subjective:   Bruce Donath, am serving as a scribe for Dr. Antoine Primas.  I'm seeing this patient by the request  of:  Mliss Sax, MD  CC: Low back pain follow-up  GEX:[id number]  02/26/2023 We reviewed that stretching is critically important to the treatment of PF.   Reviewed footwear. Rigid soles have been shown to help with PF. Night splints can sometimes help. Reviewed rehab of stretching and calf raises.    Could benefit from a corticosteroid injection, orthotics, or other measures if conservative treatment fails. Worsening pain consider formal physical therapy or injections.     Do believe the patient's pain is likely more secondary to the back again.  Has responded relatively well to an epidural previously in the back and do want a repeated.  Will get x-rays to further evaluate to see if any other bony abnormalities could be potentially contributing.  Discussed icing regimen and home exercises, increase activity slowly otherwise.  Follow-up with me again 6 weeks after the epidural to see how patient is responding      Update 04/29/2023 MAEDELL HEDGER is a 68 y.o. female coming in with complaint of lumbar spine and R foot pain. Patient states that she is doing a lot better. Still has pain at times but able to manage it now.  Patient would state that overall she is feeling 80% better.     Past Medical History:  Diagnosis Date   Arthritis    Complication of anesthesia    Family history of adverse reaction to anesthesia    mother- syncope, N/V   Pneumonia    hx of years ago   PONV (postoperative nausea and vomiting)    Past Surgical History:  Procedure Laterality Date   COLONOSCOPY     HARDWARE REMOVAL Left 11/20/2020   Procedure: LEFT HIP HARDWARE REMOVAL;  Surgeon: Marcene Corning, MD;  Location: [facility];  Service: Orthopedics;  Laterality: Left;   HIP  FRACTURE SURGERY Left    right hip replacement      Social History   Socioeconomic History   Marital status: Divorced    Spouse name: Not on file   Number of children: Not on file   Years of education: Not on file   Highest education level: Not on file  Occupational History   Not on file  Tobacco Use   Smoking status: Former    Current packs/day: 0.00    Types: Cigarettes    Quit date: 01/20/1986    Years since quitting: 37.2   Smokeless tobacco: Never  Vaping Use   Vaping status: Never Used  Substance and Sexual Activity   Alcohol use: Yes    Alcohol/week: 7.0 standard drinks of alcohol    Types: 7 Glasses of wine per week    Comment: glass wine every day   Drug use: Never   Sexual activity: Not on file  Other Topics Concern   Not on file  Social History Narrative   Not on file   Social Drivers of Health   Financial Resource Strain: Low Risk  (04/22/2022)   Overall Financial Resource Strain (CARDIA)    Difficulty of Paying Living Expenses: Not very hard  Food Insecurity: No Food Insecurity (04/22/2022)   Hunger Vital Sign    Worried About Running Out of Food in the Last Year: Never true    Ran Out of Food in the Last  Year: Never true  Transportation Needs: No Transportation Needs (04/22/2022)   PRAPARE - Administrator, Civil Service (Medical): No    Lack of Transportation (Non-Medical): No  Physical Activity: Sufficiently Active (04/22/2022)   Exercise Vital Sign    Days of Exercise per Week: 7 days    Minutes of Exercise per Session: 30 min  Stress: No Stress Concern Present (04/22/2022)   [employer] of Occupational Health - Occupational Stress Questionnaire    Feeling of Stress : Not at all  Social Connections: Moderately Isolated (04/22/2022)   Social Connection and Isolation Panel [NHANES]    Frequency of Communication with Friends and Family: More than three times a week    Frequency of Social Gatherings with Friends and Family: Three times a week     Attends Religious Services: Never    Active Member of Clubs or Organizations: Yes    Attends Engineer, structural: More than 4 times per year    Marital Status: Divorced   No Known Allergies Family History  Problem Relation Age of Onset   Colon cancer Mother    Breast cancer Mother        early 50's   Esophageal cancer Neg Hx    Rectal cancer Neg Hx    Stomach cancer Neg Hx          Current Outpatient Medications (Other):    CALCIUM PO, Take 1 tablet by mouth daily.   carbamide peroxide (DEBROX) 6.5 % OTIC solution, Place 5 drops into both ears 2 (two) times daily.   cholecalciferol (VITAMIN D3) 25 MCG (1000 UNIT) tablet, Take 1,000 Units by mouth daily.   gabapentin (NEURONTIN) 100 MG capsule, TAKE 1 CAPSULE BY MOUTH 2 TIMES DAILY.   Multiple Vitamins-Minerals (WOMENS MULTIVITAMIN PO), Take by mouth.   tetrahydrozoline 0.05 % ophthalmic solution, Place 1 drop into both eyes daily as needed (dry eyes).   Objective  Blood pressure 112/82, pulse 64, height 5' 2.5" (1.588 m), SpO2 98%.   General: No apparent distress alert and oriented x3 mood and affect normal, dressed appropriately.  HEENT: Pupils equal, extraocular movements intact  Respiratory: Patient's speak in full sentences and does not appear short of breath  Cardiovascular: No lower extremity edema, non tender, no erythema  Sitting extremely uncomfortable at the moment.  Able to get out of his seat without any difficulty.  Neurovascularly intact distally.  Improvement in range of motion noted.    Impression and Recommendations:    The above documentation has been reviewed and is accurate and complete Judi Saa, DO

## 2023-04-29 ENCOUNTER — Ambulatory Visit: Payer: PPO | Admitting: Family Medicine

## 2023-04-29 VITALS — BP 112/82 | HR 64 | Ht 62.5 in

## 2023-04-29 DIAGNOSIS — M5416 Radiculopathy, lumbar region: Secondary | ICD-10-CM

## 2023-04-29 NOTE — Assessment & Plan Note (Signed)
 Patient does have some radicular symptoms that has improved significantly with the injection.  Discussed icing regimen and home exercises, discussed which activities to do and which ones to avoid.  At this point with patient responding so well to the epidural we can repeat as needed.  Continue to increase activity slowly.  Follow-up with me again more on an as-needed basis.

## 2023-05-18 ENCOUNTER — Ambulatory Visit: Payer: PPO

## 2023-06-03 DIAGNOSIS — H6123 Impacted cerumen, bilateral: Secondary | ICD-10-CM | POA: Diagnosis not present

## 2023-06-30 ENCOUNTER — Telehealth: Payer: Self-pay | Admitting: Family Medicine

## 2023-06-30 ENCOUNTER — Other Ambulatory Visit: Payer: Self-pay

## 2023-06-30 DIAGNOSIS — M5416 Radiculopathy, lumbar region: Secondary | ICD-10-CM

## 2023-06-30 NOTE — Telephone Encounter (Signed)
 Patient called stating that her back has started to hurt again.  She said that Dr Felipe Horton told to her call when it started to bother her for another epidural?  Please advise.

## 2023-07-23 ENCOUNTER — Ambulatory Visit
Admission: RE | Admit: 2023-07-23 | Discharge: 2023-07-23 | Disposition: A | Discharge status: Home / Self Care | Source: Ambulatory Visit | Attending: Family Medicine | Admitting: Family Medicine

## 2023-07-23 DIAGNOSIS — M5416 Radiculopathy, lumbar region: Secondary | ICD-10-CM | POA: Diagnosis not present

## 2023-07-23 MED ORDER — METHYLPREDNISOLONE ACETATE 40 MG/ML INJ SUSP (RADIOLOG
80.0000 mg | Freq: Once | INTRAMUSCULAR | Status: AC
  Administered 2023-07-23: 80 mg via EPIDURAL

## 2023-07-23 MED ORDER — IOPAMIDOL (ISOVUE-M 200) INJECTION 41%
1.0000 mL | Freq: Once | INTRAMUSCULAR | Status: AC
  Administered 2023-07-23: 1 mL via EPIDURAL

## 2023-07-23 NOTE — Discharge Instructions (Signed)

## 2023-07-25 ENCOUNTER — Other Ambulatory Visit: Payer: Self-pay | Admitting: Family Medicine

## 2023-07-28 DIAGNOSIS — H25813 Combined forms of age-related cataract, bilateral: Secondary | ICD-10-CM | POA: Diagnosis not present

## 2023-07-28 DIAGNOSIS — H52203 Unspecified astigmatism, bilateral: Secondary | ICD-10-CM | POA: Diagnosis not present

## 2023-07-28 DIAGNOSIS — H527 Unspecified disorder of refraction: Secondary | ICD-10-CM | POA: Diagnosis not present

## 2023-08-28 ENCOUNTER — Ambulatory Visit

## 2023-08-28 VITALS — BP 118/62 | HR 60 | Temp 97.6°F | Ht 62.5 in | Wt 118.5 lb

## 2023-08-28 DIAGNOSIS — Z0000 Encounter for general adult medical examination without abnormal findings: Secondary | ICD-10-CM | POA: Diagnosis not present

## 2023-08-28 NOTE — Patient Instructions (Signed)
 [name] , Thank you for taking time out of your busy schedule to complete your Annual Wellness Visit with me. I enjoyed our conversation and look forward to speaking with you again next year. I, as well as your care team,  appreciate your ongoing commitment to your health goals. Please review the following plan we discussed and let me know if I can assist you in the future. Your Game plan/ To Do List    Referrals: If you haven't heard from the office you've been referred to, please reach out to them at the phone provided.   Follow up Visits: We will see or speak with you next year for your Next Medicare AWV with our clinical staff Have you seen your provider in the last 6 months (3 months if uncontrolled diabetes)? No  Clinician Recommendations:  Aim for 30 minutes of exercise or brisk walking, 6-8 glasses of water, and 5 servings of fruits and vegetables each day.       This is a list of the screenings recommended for you:  Health Maintenance  Topic Date Due   Hepatitis C Screening  Never done   Pneumococcal Vaccine for age over 55 (1 of 1 - PCV) Never done   Colon Cancer Screening  04/27/2023   COVID-19 Vaccine (6 - 2024-25 season) 04/28/2023   Flu Shot  08/21/2023   Medicare Annual Wellness Visit  08/27/2024   Mammogram  12/15/2024   DTaP/Tdap/Td vaccine (2 - Td or Tdap) 10/12/2028   DEXA scan (bone density measurement)  Completed   Zoster (Shingles) Vaccine  Completed   Hepatitis B Vaccine  Aged Out   HPV Vaccine  Aged Out   Meningitis B Vaccine  Aged Out    Advanced directives: (Provided) Advance directive discussed with you today. I have provided a copy for you to complete at home and have notarized. Once this is complete, please bring a copy in to our office so we can scan it into your chart.  Advance Care Planning is important because it:  [x]  Makes sure you receive the medical care that is consistent with your values, goals, and preferences  [x]  It provides guidance  to your family and loved ones and reduces their decisional burden about whether or not they are making the right decisions based on your wishes.  Follow the link provided in your after visit summary or read over the paperwork we have mailed to you to help you started getting your Advance Directives in place. If you need assistance in completing these, please reach out to us  so that we can help you!  See attachments for Preventive Care and Fall Prevention Tips.

## 2023-08-28 NOTE — Progress Notes (Signed)
 Subjective:   Mary [name] is a 68 y.o. who presents for a Medicare Wellness preventive visit.  As a reminder, Annual Wellness Visits don't include a physical exam, and some assessments may be limited, especially if this visit is performed virtually. We may recommend an in-person follow-up visit with your provider if needed.  Visit Complete: In person    Persons Participating in Visit: Patient.  AWV Questionnaire: No: Patient Medicare AWV questionnaire was not completed prior to this visit.  Cardiac Risk Factors include: advanced age (>4men, >78 women)     Objective:    Today's Vitals   08/28/23 1013  BP: 118/62  Pulse: 60  Temp: 97.6 F (36.4 C)  TempSrc: Oral  SpO2: 96%  Weight: 118 lb 8 oz (53.8 kg)  Height: 5' 2.5 (1.588 m)   Body mass index is 21.33 kg/m.     08/28/2023   10:19 AM 11/14/2020    9:12 AM 02/28/2016    7:41 AM 02/14/2016    1:19 PM  Advanced Directives  Does Patient Have a Medical Advance Directive? No No Yes  Yes   Type of Chief of Staff of [place];Living will  Copy of Healthcare Power of Attorney in Chart?    No - copy requested   Would patient like information on creating a medical advance directive? Yes (MAU/Ambulatory/Procedural Areas - Information given)        Data saved with a previous flowsheet row definition    Current Medications (verified) Outpatient Encounter Medications as of 08/28/2023  Medication Sig   CALCIUM PO Take 1 tablet by mouth daily.   carbamide peroxide (DEBROX) 6.5 % OTIC solution Place 5 drops into both ears 2 (two) times daily.   cholecalciferol (VITAMIN D3) 25 MCG (1000 UNIT) tablet Take 1,000 Units by mouth daily.   gabapentin  (NEURONTIN ) 100 MG capsule TAKE 1 CAPSULE BY MOUTH 2 TIMES DAILY.   Multiple Vitamins-Minerals (WOMENS MULTIVITAMIN PO) Take by mouth.   tetrahydrozoline 0.05 % ophthalmic solution Place 1 drop into both eyes daily as needed (dry eyes).    No facility-administered encounter medications on file as of 08/28/2023.    Allergies (verified) Patient has no known allergies.   History: Past Medical History:  Diagnosis Date   Arthritis    Complication of anesthesia    Family history of adverse reaction to anesthesia    mother- syncope, N/V   Pneumonia    hx of years ago   PONV (postoperative nausea and vomiting)    Past Surgical History:  Procedure Laterality Date   COLONOSCOPY     HARDWARE REMOVAL Left 11/20/2020   Procedure: LEFT HIP HARDWARE REMOVAL;  Surgeon: Sheril Coy, MD;  Location: [facility];  Service: Orthopedics;  Laterality: Left;   HIP FRACTURE SURGERY Left    right hip replacement      Family History  Problem Relation Age of Onset   Colon cancer Mother    Breast cancer Mother        early 79's   Esophageal cancer Neg Hx    Rectal cancer Neg Hx    Stomach cancer Neg Hx    Social History   Socioeconomic History   Marital status: Divorced    Spouse name: Not on file   Number of children: Not on file   Years of education: Not on file   Highest education level: Not on file  Occupational History   Not on file  Tobacco Use  Smoking status: Former    Current packs/day: 0.00    Types: Cigarettes    Quit date: 01/20/1986    Years since quitting: 37.6   Smokeless tobacco: Never  Vaping Use   Vaping status: Never Used  Substance and Sexual Activity   Alcohol use: Yes    Alcohol/week: 7.0 standard drinks of alcohol    Types: 7 Glasses of wine per week    Comment: glass wine every day   Drug use: Never   Sexual activity: Not on file  Other Topics Concern   Not on file  Social History Narrative   Not on file   Social Drivers of Health   Financial Resource Strain: Low Risk  (08/28/2023)   Overall Financial Resource Strain (CARDIA)    Difficulty of Paying Living Expenses: Not hard at all  Food Insecurity: No Food Insecurity (08/28/2023)   Hunger Vital Sign    Worried About Running Out of Food in  the Last Year: Never true    Ran Out of Food in the Last Year: Never true  Transportation Needs: No Transportation Needs (08/28/2023)   PRAPARE - Administrator, Civil Service (Medical): No    Lack of Transportation (Non-Medical): No  Physical Activity: Sufficiently Active (08/28/2023)   Exercise Vital Sign    Days of Exercise per Week: 7 days    Minutes of Exercise per Session: 60 min  Stress: No Stress Concern Present (08/28/2023)   [employer] of Occupational Health - Occupational Stress Questionnaire    Feeling of Stress: Only a little  Social Connections: Moderately Isolated (08/28/2023)   Social Connection and Isolation Panel    Frequency of Communication with Friends and Family: More than three times a week    Frequency of Social Gatherings with Friends and Family: More than three times a week    Attends Religious Services: Never    Database administrator or Organizations: Yes    Attends Engineer, structural: More than 4 times per year    Marital Status: Divorced    Tobacco Counseling Counseling given: Not Answered    Clinical Intake:  Pre-visit preparation completed: Yes  Pain : No/denies pain     Nutritional Status: BMI of 19-24  Normal Nutritional Risks: None Diabetes: No  No results found for: HGBA1C   How often do you need to have someone help you when you read instructions, pamphlets, or other written materials from your doctor or pharmacy?: 1 - Never  Interpreter Needed?: No  Information entered by :: NAllen LPN   Activities of Daily Living     08/28/2023   10:14 AM  In your present state of health, do you have any difficulty performing the following activities:  Hearing? 0  Vision? 0  Difficulty concentrating or making decisions? 0  Walking or climbing stairs? 0  Dressing or bathing? 0  Doing errands, shopping? 0  Preparing Food and eating ? N  Using the Toilet? N  In the past six months, have you accidently leaked urine?  N  Do you have problems with loss of bowel control? N  Managing your Medications? N  Managing your Finances? N  Housekeeping or managing your Housekeeping? N    Patient Care Team: Berneta Elsie Sayre, MD as PCP - [facility] (Family Medicine) [facility], [facility], [facility], [facility] (Diagnostic Radiology)  I have updated your Care Teams any recent Medical Services you may have received from other providers in  the past year.     Assessment:   This is a routine wellness examination for [employer].  Hearing/Vision screen Hearing Screening - Comments:: Denies hearing issues Vision Screening - Comments:: Regular eye exams, [facility]   Goals Addressed             This Visit's Progress    Patient Stated       08/28/2023, wants to lose weight       Depression Screen     08/28/2023   10:22 AM 04/22/2022    3:06 PM 02/07/2022   10:36 AM 05/30/2020   10:18 AM  PHQ 2/9 Scores  PHQ - 2 Score 0 0 0 0  PHQ- 9 Score 2       Fall Risk     08/28/2023   10:22 AM 04/22/2022    3:09 PM 05/30/2020   10:18 AM  Fall Risk   Falls in the past year? 0 0 0  Number falls in past yr: 0 0   Injury with Fall? 0 0   Risk for fall due to : No Fall Risks No Fall Risks   Follow up Falls evaluation completed;Falls prevention discussed      MEDICARE RISK AT HOME:  Medicare Risk at Home Any stairs in or around the home?: Yes If so, are there any without handrails?: Yes Home free of loose throw rugs in walkways, pet beds, electrical cords, etc?: Yes Adequate lighting in your home to reduce risk of falls?: Yes Life alert?: No Use of a cane, walker or w/c?: No Grab bars in the bathroom?: No Shower chair or bench in shower?: No Elevated toilet seat or a handicapped toilet?: Yes  TIMED UP AND GO:  Was the test performed?  Yes  Length of time to ambulate 10 feet: 5 sec Gait steady and fast without use of assistive device  Cognitive Function: 6CIT  completed        08/28/2023   10:24 AM 04/22/2022    3:07 PM  6CIT Screen  What Year? 0 points 0 points  What month? 0 points 0 points  What time? 0 points 0 points  Count back from 20 0 points 0 points  Months in reverse 0 points 0 points  Repeat phrase 0 points 0 points  Total Score 0 points 0 points    Immunizations Immunization History  Administered Date(s) Administered   Influenza, Quadrivalent, Recombinant, Inj, Pf 11/05/2017   Influenza-Unspecified 11/08/2020   Moderna Covid-19 Fall Seasonal Vaccine 29yrs & older 10/26/2021   PFIZER(Purple Top)SARS-COV-2 Vaccination 04/04/2019, 06/05/2019, 12/03/2019   Pfizer(Comirnaty)Fall Seasonal Vaccine 12 years and older 10/28/2022   Tdap 10/13/2018   Zoster Recombinant(Shingrix) 10/13/2018, 01/05/2019    Screening Tests Health Maintenance  Topic Date Due   Hepatitis C Screening  Never done   Pneumococcal Vaccine: 50+ Years (1 of 1 - PCV) Never done   Colonoscopy  04/27/2023   COVID-19 Vaccine (6 - 2024-25 season) 04/28/2023   INFLUENZA VACCINE  08/21/2023   Medicare Annual Wellness (AWV)  08/27/2024   MAMMOGRAM  12/15/2024   DTaP/Tdap/Td (2 - Td or Tdap) 10/12/2028   DEXA SCAN  Completed   Zoster Vaccines- Shingrix  Completed   Hepatitis B Vaccines  Aged Out   HPV VACCINES  Aged Out   Meningococcal B Vaccine  Aged Out    Health Maintenance  Health Maintenance Due  Topic Date Due   Hepatitis C Screening  Never done   Pneumococcal Vaccine: 50+ Years (1 of 1 -  PCV) Never done   Colonoscopy  04/27/2023   COVID-19 Vaccine (6 - 2024-25 season) 04/28/2023   INFLUENZA VACCINE  08/21/2023   Health Maintenance Items Addressed: Patient calling to schedule colonoscopy. Due for pneumonia vaccine.  Additional Screening:  Vision Screening: Recommended annual ophthalmology exams for early detection of glaucoma and other disorders of the eye. Would you like a referral to an eye doctor? No    Dental Screening: Recommended  annual dental exams for proper oral hygiene  Community Resource Referral / Chronic Care Management: CRR required this visit?  No   CCM required this visit?  No   Plan:    I have personally reviewed and noted the following in the patient's chart:   Medical and social history Use of alcohol, tobacco or illicit drugs  Current medications and supplements including opioid prescriptions. Patient is not currently taking opioid prescriptions. Functional ability and status Nutritional status Physical activity Advanced directives List of other physicians Hospitalizations, surgeries, and ER visits in previous 12 months Vitals Screenings to include cognitive, depression, and falls Referrals and appointments  In addition, I have reviewed and discussed with patient certain preventive protocols, quality metrics, and best practice recommendations. A written personalized care plan for preventive services as well as general preventive health recommendations were provided to patient.   Ardella FORBES Dawn, LPN   01/21/7972   After Visit Summary: (In Person-Declined) Patient declined AVS at this time.  Notes: Nothing significant to report at this time.

## 2023-09-01 ENCOUNTER — Encounter: Payer: Self-pay | Admitting: Family Medicine

## 2023-09-01 ENCOUNTER — Ambulatory Visit (INDEPENDENT_AMBULATORY_CARE_PROVIDER_SITE_OTHER): Admitting: Family Medicine

## 2023-09-01 VITALS — BP 100/72 | HR 72 | Temp 97.4°F | Ht 62.0 in | Wt 118.8 lb

## 2023-09-01 DIAGNOSIS — Z13220 Encounter for screening for lipoid disorders: Secondary | ICD-10-CM

## 2023-09-01 DIAGNOSIS — Z0000 Encounter for general adult medical examination without abnormal findings: Secondary | ICD-10-CM | POA: Diagnosis not present

## 2023-09-01 DIAGNOSIS — Z131 Encounter for screening for diabetes mellitus: Secondary | ICD-10-CM

## 2023-09-01 NOTE — Progress Notes (Signed)
 Established Patient Office Visit   Subjective:  Patient ID: Mary [name], female    DOB: 1955-12-02  Age: 68 y.o. MRN: [id number]  Chief Complaint  Patient presents with   Annual Exam    Cpe. Pt is not fasting.     HPI Encounter Diagnoses  Name Primary?   Healthcare maintenance Yes   Screening for cholesterol level    Screening for diabetes mellitus    Here for physical.  She is doing well.  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  She does have regular dental care.  She lives independently.  She has a sister who lives up in [place] .  Ongoing issues with lumbago treated with every 6 month steroid injections.  Colonoscopy is scheduled in October.   Review of Systems  Constitutional: Negative.   HENT: Negative.    Eyes:  Negative for blurred vision, discharge and redness.  Respiratory: Negative.    Cardiovascular: Negative.   Gastrointestinal:  Negative for abdominal pain.  Genitourinary: Negative.   Musculoskeletal:  Positive for back pain. Negative for myalgias.  Skin:  Negative for rash.  Neurological:  Negative for tingling, loss of consciousness and weakness.  Endo/Heme/Allergies:  Negative for polydipsia.     Current Outpatient Medications:    CALCIUM PO, Take 1 tablet by mouth daily., Disp: , Rfl:    carbamide peroxide (DEBROX) 6.5 % OTIC solution, Place 5 drops into both ears 2 (two) times daily., Disp: 15 mL, Rfl: 4   cholecalciferol (VITAMIN D3) 25 MCG (1000 UNIT) tablet, Take 1,000 Units by mouth daily., Disp: , Rfl:    gabapentin  (NEURONTIN ) 100 MG capsule, TAKE 1 CAPSULE BY MOUTH 2 TIMES DAILY., Disp: 60 capsule, Rfl: 1   Multiple Vitamins-Minerals (WOMENS MULTIVITAMIN PO), Take by mouth., Disp: , Rfl:    tetrahydrozoline 0.05 % ophthalmic solution, Place 1 drop into both eyes daily as needed (dry eyes)., Disp: , Rfl:    Objective:     BP 100/72 (BP Location: Right Arm, Cuff Size: Normal)   Pulse 72   Temp (!) 97.4 F (36.3  C) (Temporal)   Ht 5' 2 (1.575 m)   Wt 118 lb 12.8 oz (53.9 kg)   SpO2 99%   BMI 21.73 kg/m  Wt Readings from Last 3 Encounters:  09/01/23 118 lb 12.8 oz (53.9 kg)  08/28/23 118 lb 8 oz (53.8 kg)  02/26/23 121 lb (54.9 kg)      Physical Exam Constitutional:      General: She is not in acute distress.    Appearance: Normal appearance. She is not ill-appearing, toxic-appearing or diaphoretic.  HENT:     Head: Normocephalic and atraumatic.     Right Ear: Tympanic membrane, ear canal and external ear normal.     Left Ear: Tympanic membrane, ear canal and external ear normal.     Mouth/Throat:     Mouth: Mucous membranes are moist.     Pharynx: Oropharynx is clear. No oropharyngeal exudate or posterior oropharyngeal erythema.  Eyes:     General: No scleral icterus.       Right eye: No discharge.        Left eye: No discharge.     Extraocular Movements: Extraocular movements intact.     Conjunctiva/sclera: Conjunctivae normal.     Pupils: Pupils are equal, round, and reactive to light.  Cardiovascular:     Rate and Rhythm: Normal rate and regular rhythm.  Pulmonary:  Effort: Pulmonary effort is normal. No respiratory distress.     Breath sounds: Normal breath sounds. No wheezing or rales.  Abdominal:     General: Bowel sounds are normal.     Tenderness: There is no abdominal tenderness. There is no right CVA tenderness, left CVA tenderness, guarding or rebound.  Musculoskeletal:     Cervical back: No rigidity or tenderness.  Skin:    General: Skin is warm and dry.  Neurological:     Mental Status: She is alert and oriented to person, place, and time.  Psychiatric:        Mood and Affect: Mood normal.        Behavior: Behavior normal.      No results found for any visits on 09/01/23.    The ASCVD Risk score (Arnett DK, et al., 2019) failed to calculate for the following reasons:   Cannot find a previous HDL lab   Cannot find a previous total cholesterol lab     Assessment & Plan:   Healthcare maintenance -     CBC with Differential/Platelet; Future -     Urinalysis, Routine w reflex microscopic; Future  Screening for cholesterol level -     Comprehensive metabolic panel with GFR -     Lipid panel; Future  Screening for diabetes mellitus -     Comprehensive metabolic panel with GFR -     Hemoglobin A1c; Future    Return in about 1 year (around 08/31/2024).  Continue healthy active lifestyle.  Information on health maintenance and disease prevention given.  Declines the Prevnar vaccine today but will consider it.  Information was given on the Prevnar vaccine.  Mary Sim Lent, MD

## 2023-09-02 ENCOUNTER — Other Ambulatory Visit (INDEPENDENT_AMBULATORY_CARE_PROVIDER_SITE_OTHER)

## 2023-09-02 DIAGNOSIS — Z0000 Encounter for general adult medical examination without abnormal findings: Secondary | ICD-10-CM | POA: Diagnosis not present

## 2023-09-02 DIAGNOSIS — Z131 Encounter for screening for diabetes mellitus: Secondary | ICD-10-CM | POA: Diagnosis not present

## 2023-09-02 DIAGNOSIS — Z13220 Encounter for screening for lipoid disorders: Secondary | ICD-10-CM

## 2023-09-02 LAB — CBC WITH DIFFERENTIAL/PLATELET
Basophils Absolute: 0 K/uL (ref 0.0–0.1)
Basophils Relative: 0.4 % (ref 0.0–3.0)
Eosinophils Absolute: 0.1 K/uL (ref 0.0–0.7)
Eosinophils Relative: 2.3 % (ref 0.0–5.0)
HCT: 40 % (ref 36.0–46.0)
Hemoglobin: 13.3 g/dL (ref 12.0–15.0)
Lymphocytes Relative: 29.2 % (ref 12.0–46.0)
Lymphs Abs: 1.8 K/uL (ref 0.7–4.0)
MCHC: 33.3 g/dL (ref 30.0–36.0)
MCV: 98.7 fl (ref 78.0–100.0)
Monocytes Absolute: 0.5 K/uL (ref 0.1–1.0)
Monocytes Relative: 7.7 % (ref 3.0–12.0)
Neutro Abs: 3.7 K/uL (ref 1.4–7.7)
Neutrophils Relative %: 60.4 % (ref 43.0–77.0)
Platelets: 224 K/uL (ref 150.0–400.0)
RBC: 4.05 Mil/uL (ref 3.87–5.11)
RDW: 13.2 % (ref 11.5–15.5)
WBC: 6.2 K/uL (ref 4.0–10.5)

## 2023-09-02 LAB — LIPID PANEL
Cholesterol: 220 mg/dL — ABNORMAL HIGH (ref 0–200)
HDL: 77.2 mg/dL (ref >39.00)
LDL Cholesterol: 129 mg/dL — ABNORMAL HIGH (ref 0–99)
NonHDL: 142.91
Total CHOL/HDL Ratio: 3
Triglycerides: 72 mg/dL (ref 0.0–149.0)
VLDL: 14.4 mg/dL (ref 0.0–40.0)

## 2023-09-02 LAB — HEMOGLOBIN A1C: Hgb A1c MFr Bld: 5.9 % (ref 4.6–6.5)

## 2023-09-03 ENCOUNTER — Ambulatory Visit: Payer: Self-pay | Admitting: Family Medicine

## 2023-10-06 ENCOUNTER — Encounter

## 2023-10-06 ENCOUNTER — Encounter: Payer: Self-pay | Admitting: Gastroenterology

## 2023-10-06 ENCOUNTER — Ambulatory Visit (AMBULATORY_SURGERY_CENTER)

## 2023-10-06 VITALS — Ht 62.0 in | Wt 116.0 lb

## 2023-10-06 DIAGNOSIS — Z860100 Personal history of colon polyps, unspecified: Secondary | ICD-10-CM

## 2023-10-06 DIAGNOSIS — Z800 Family history of malignant neoplasm of digestive organs: Secondary | ICD-10-CM

## 2023-10-06 MED ORDER — NA SULFATE-K SULFATE-MG SULF 17.5-3.13-1.6 GM/177ML PO SOLN: 1.0000 | Freq: Once | ORAL | 0 refills | Status: AC

## 2023-10-06 NOTE — Progress Notes (Signed)

## 2023-10-07 ENCOUNTER — Telehealth: Payer: Self-pay | Admitting: Family Medicine

## 2023-10-07 NOTE — Telephone Encounter (Signed)
 Patient called requesting another epidural be order for her if possible?

## 2023-10-08 ENCOUNTER — Other Ambulatory Visit: Payer: Self-pay

## 2023-10-08 DIAGNOSIS — M5416 Radiculopathy, lumbar region: Secondary | ICD-10-CM

## 2023-10-14 ENCOUNTER — Ambulatory Visit
Admission: RE | Admit: 2023-10-14 | Discharge: 2023-10-14 | Disposition: A | Discharge status: Home / Self Care | Source: Ambulatory Visit | Attending: Family Medicine | Admitting: Family Medicine

## 2023-10-14 DIAGNOSIS — M4727 Other spondylosis with radiculopathy, lumbosacral region: Secondary | ICD-10-CM | POA: Diagnosis not present

## 2023-10-14 DIAGNOSIS — M5416 Radiculopathy, lumbar region: Secondary | ICD-10-CM

## 2023-10-14 MED ORDER — METHYLPREDNISOLONE ACETATE 40 MG/ML INJ SUSP (RADIOLOG
80.0000 mg | Freq: Once | INTRAMUSCULAR | Status: AC
  Administered 2023-10-14: 80 mg via EPIDURAL

## 2023-10-14 MED ORDER — IOPAMIDOL (ISOVUE-M 200) INJECTION 41%
1.0000 mL | Freq: Once | INTRAMUSCULAR | Status: AC
  Administered 2023-10-14: 1 mL via EPIDURAL

## 2023-10-14 NOTE — Discharge Instructions (Signed)

## 2023-10-15 ENCOUNTER — Other Ambulatory Visit

## 2023-10-23 ENCOUNTER — Encounter: Payer: Self-pay | Admitting: Gastroenterology

## 2023-10-23 ENCOUNTER — Ambulatory Visit: Admitting: Gastroenterology

## 2023-10-23 VITALS — BP 135/77 | HR 60 | Temp 97.2°F | Resp 15 | Ht 62.0 in | Wt 116.0 lb

## 2023-10-23 DIAGNOSIS — Z860100 Personal history of colon polyps, unspecified: Secondary | ICD-10-CM

## 2023-10-23 DIAGNOSIS — K621 Rectal polyp: Secondary | ICD-10-CM

## 2023-10-23 DIAGNOSIS — K648 Other hemorrhoids: Secondary | ICD-10-CM | POA: Diagnosis not present

## 2023-10-23 DIAGNOSIS — K635 Polyp of colon: Secondary | ICD-10-CM | POA: Diagnosis not present

## 2023-10-23 DIAGNOSIS — D128 Benign neoplasm of rectum: Secondary | ICD-10-CM

## 2023-10-23 DIAGNOSIS — M129 Arthropathy, unspecified: Secondary | ICD-10-CM | POA: Diagnosis not present

## 2023-10-23 DIAGNOSIS — Z1211 Encounter for screening for malignant neoplasm of colon: Secondary | ICD-10-CM

## 2023-10-23 DIAGNOSIS — K644 Residual hemorrhoidal skin tags: Secondary | ICD-10-CM

## 2023-10-23 DIAGNOSIS — D125 Benign neoplasm of sigmoid colon: Secondary | ICD-10-CM | POA: Diagnosis not present

## 2023-10-23 DIAGNOSIS — Z860101 Personal history of adenomatous and serrated colon polyps: Secondary | ICD-10-CM | POA: Diagnosis not present

## 2023-10-23 DIAGNOSIS — K5730 Diverticulosis of large intestine without perforation or abscess without bleeding: Secondary | ICD-10-CM | POA: Diagnosis not present

## 2023-10-23 DIAGNOSIS — Z800 Family history of malignant neoplasm of digestive organs: Secondary | ICD-10-CM

## 2023-10-23 MED ORDER — SODIUM CHLORIDE 0.9 % IV SOLN: 500.0000 mL | Freq: Once | INTRAVENOUS | Status: DC

## 2023-10-23 NOTE — Progress Notes (Signed)
Pt. states no medical or surgical changes since previsit or office visit. 

## 2023-10-23 NOTE — Patient Instructions (Addendum)
 Continue present medications. Await pathology results. Repeat colonoscopy in 3 - 5 years for surveillance based on pathology results. For future colonoscopy the patient will require an extended preparation. If there are any questions, please contact the gastroenterologist.  Please read over handouts provided   YOU HAD AN ENDOSCOPIC PROCEDURE TODAY AT THE [facility]:   Refer to the procedure report that was given to you for any specific questions about what was found during the examination.  If the procedure report does not answer your questions, please call your gastroenterologist to clarify.  If you requested that your care partner not be given the details of your procedure findings, then the procedure report has been included in a sealed envelope for you to review at your convenience later.  YOU SHOULD EXPECT: Some feelings of bloating in the abdomen. Passage of more gas than usual.  Walking can help get rid of the air that was put into your GI tract during the procedure and reduce the bloating. If you had a lower endoscopy (such as a colonoscopy or flexible sigmoidoscopy) you may notice spotting of blood in your stool or on the toilet paper. If you underwent a bowel prep for your procedure, you may not have a normal bowel movement for a few days.  Please Note:  You might notice some irritation and congestion in your nose or some drainage.  This is from the oxygen used during your procedure.  There is no need for concern and it should clear up in a day or so.  SYMPTOMS TO REPORT IMMEDIATELY:  Following lower endoscopy (colonoscopy or flexible sigmoidoscopy):  Excessive amounts of blood in the stool  Significant tenderness or worsening of abdominal pains  Swelling of the abdomen that is new, acute  Fever of 100F or higher For urgent or emergent issues, a gastroenterologist can be reached at any hour by calling [phone]. Do not use MyChart messaging for urgent concerns.     DIET:  We do recommend a small meal at first, but then you may proceed to your regular diet.  Drink plenty of fluids but you should avoid alcoholic beverages for 24 hours.  ACTIVITY:  You should plan to take it easy for the rest of today and you should NOT DRIVE or use heavy machinery until tomorrow (because of the sedation medicines used during the test).    FOLLOW UP: Our staff will call the number listed on your records the next business day following your procedure.  We will call around 7:15- 8:00 am to check on you and address any questions or concerns that you may have regarding the information given to you following your procedure. If we do not reach you, we will leave a message.     If any biopsies were taken you will be contacted by phone or by letter within the next 1-3 weeks.  Please call us  at [phone] if you have not heard about the biopsies in 3 weeks.    SIGNATURES/CONFIDENTIALITY: You and/or your care partner have signed paperwork which will be entered into your electronic medical record.  These signatures attest to the fact that that the information above on your After Visit Summary has been reviewed and is understood.  Full responsibility of the confidentiality of this discharge information lies with you and/or your care-partner.

## 2023-10-23 NOTE — Progress Notes (Signed)
 Called to room to assist during endoscopic procedure.  Patient ID and intended procedure confirmed with present staff. Received instructions for my participation in the procedure from the performing physician.

## 2023-10-23 NOTE — Progress Notes (Signed)
 [facility] History and Physical   Primary Care Physician:  Berneta Elsie Sayre, MD   Reason for Procedure:  History of adenomatous colon polyps  Plan:    Surveillance colonoscopy with possible interventions as needed     HPI: Mary [name] is a very pleasant 68 y.o. female here for surveillance colonoscopy. Denies any nausea, vomiting, abdominal pain, melena or bright red blood per rectum  The risks and benefits as well as alternatives of endoscopic procedure(s) have been discussed and reviewed. All questions answered. The patient agrees to proceed.    Past Medical History:  Diagnosis Date   Arthritis    Complication of anesthesia    Family history of adverse reaction to anesthesia    mother- syncope, N/V   Pneumonia    hx of years ago   PONV (postoperative nausea and vomiting)     Past Surgical History:  Procedure Laterality Date   COLONOSCOPY     HARDWARE REMOVAL Left 11/20/2020   Procedure: LEFT HIP HARDWARE REMOVAL;  Surgeon: Sheril Coy, MD;  Location: [facility];  Service: Orthopedics;  Laterality: Left;   HIP FRACTURE SURGERY Left    right hip replacement       Prior to Admission medications   Medication Sig Start Date End Date Taking? Authorizing Provider  CALCIUM PO Take 1 tablet by mouth daily.   Yes [provider]  carbamide peroxide (DEBROX) 6.5 % OTIC solution Place 5 drops into both ears 2 (two) times daily. 05/30/20  Yes Berneta Elsie Sayre, MD  gabapentin  (NEURONTIN ) 100 MG capsule TAKE 1 CAPSULE BY MOUTH 2 TIMES DAILY. 07/26/23  Yes Smith, Zachary M, DO  Multiple Vitamins-Minerals (WOMENS MULTIVITAMIN PO) Take by mouth.   Yes [provider]  tetrahydrozoline 0.05 % ophthalmic solution Place 1 drop into both eyes daily as needed (dry eyes).   Yes [provider]  cholecalciferol (VITAMIN D3) 25 MCG (1000 UNIT) tablet Take 1,000 Units by mouth daily. Patient not taking: Reported on 10/23/2023    [provider]    Current Outpatient Medications  Medication Sig Dispense Refill   CALCIUM PO Take 1 tablet by mouth daily.     carbamide peroxide (DEBROX) 6.5 % OTIC solution Place 5 drops into both ears 2 (two) times daily. 15 mL 4   gabapentin  (NEURONTIN ) 100 MG capsule TAKE 1 CAPSULE BY MOUTH 2 TIMES DAILY. 60 capsule 1   Multiple Vitamins-Minerals (WOMENS MULTIVITAMIN PO) Take by mouth.     tetrahydrozoline 0.05 % ophthalmic solution Place 1 drop into both eyes daily as needed (dry eyes).     cholecalciferol (VITAMIN D3) 25 MCG (1000 UNIT) tablet Take 1,000 Units by mouth daily. (Patient not taking: Reported on 10/23/2023)     Current Facility-Administered Medications  Medication Dose Route Frequency Provider Last Rate Last Admin   0.9 %  sodium chloride  infusion  500 mL Intravenous Once Malikiah Debarr V, MD        Allergies as of 10/23/2023   (No Known Allergies)    Family History  Problem Relation Age of Onset   Colon cancer Mother    Breast cancer Mother        early 38's   Esophageal cancer Neg Hx    Rectal cancer Neg Hx    Stomach cancer Neg Hx     Social History   Socioeconomic History   Marital status: Divorced    Spouse name: Not on file   Number of children: Not on file  Years of education: Not on file   Highest education level: Not on file  Occupational History   Not on file  Tobacco Use   Smoking status: Former    Current packs/day: 0.00    Types: Cigarettes    Quit date: 01/20/1986    Years since quitting: 37.7   Smokeless tobacco: Never  Vaping Use   Vaping status: Never Used  Substance and Sexual Activity   Alcohol use: Yes    Alcohol/week: 7.0 standard drinks of alcohol    Types: 7 Glasses of wine per week    Comment: glass wine every day   Drug use: Never   Sexual activity: Not on file  Other Topics Concern   Not on file  Social History Narrative   Not on file   Social Drivers of Health   Financial Resource Strain: Low Risk   (08/28/2023)   Overall Financial Resource Strain (CARDIA)    Difficulty of Paying Living Expenses: Not hard at all  Food Insecurity: No Food Insecurity (08/28/2023)   Hunger Vital Sign    Worried About Running Out of Food in the Last Year: Never true    Ran Out of Food in the Last Year: Never true  Transportation Needs: No Transportation Needs (08/28/2023)   PRAPARE - Administrator, Civil Service (Medical): No    Lack of Transportation (Non-Medical): No  Physical Activity: Sufficiently Active (08/28/2023)   Exercise Vital Sign    Days of Exercise per Week: 7 days    Minutes of Exercise per Session: 60 min  Stress: No Stress Concern Present (08/28/2023)   [employer] of Occupational Health - Occupational Stress Questionnaire    Feeling of Stress: Only a little  Social Connections: Moderately Isolated (08/28/2023)   Social Connection and Isolation Panel    Frequency of Communication with Friends and Family: More than three times a week    Frequency of Social Gatherings with Friends and Family: More than three times a week    Attends Religious Services: Never    Database administrator or Organizations: Yes    Attends Engineer, structural: More than 4 times per year    Marital Status: Divorced  Intimate Partner Violence: Not At Risk (08/28/2023)   Humiliation, Afraid, Rape, and Kick questionnaire    Fear of Current or Ex-Partner: No    Emotionally Abused: No    Physically Abused: No    Sexually Abused: No    Review of Systems:  All other review of systems negative except as mentioned in the HPI.  Physical Exam: Vital signs in last 24 hours: BP (!) 144/82   Pulse (!) 56   Temp (!) 97.2 F (36.2 C) (Skin)   Ht 5' 2 (1.575 m)   Wt 116 lb (52.6 kg)   SpO2 100%   BMI 21.22 kg/m  General:   Alert, NAD Lungs:  Clear .   Heart:  Regular rate and rhythm Abdomen:  Soft, nontender and nondistended. Neuro/Psych:  Alert and cooperative. Normal mood and affect. A and  O x 3  Reviewed labs, radiology imaging, old records and pertinent past GI work up  Patient is appropriate for planned procedure(s) and anesthesia in an ambulatory setting   K. Veena Sol Englert , MD [phone]

## 2023-10-23 NOTE — Progress Notes (Signed)
 Transferred to PACU via stretcher, arousing, VSS.

## 2023-10-23 NOTE — Op Note (Signed)
 [facility] Patient Name: Mary [name] Procedure Date: 10/23/2023 11:29 AM MRN: [id number] Endoscopist: Gustav ALONSO Mcgee , MD, [id number] Age: 68 Referring MD:  Date of Birth: April 02, 1955 Gender: Female Account #: [id number] Procedure:                Colonoscopy Indications:              Screening in patient at increased risk: Family                            history of 1st-degree relative with colorectal                            cancer before age 55 years, High risk colon cancer                            surveillance: Personal history of multiple (3 or                            more) adenomas Medicines:                Monitored Anesthesia Care Procedure:                Pre-Anesthesia Assessment:                           - Prior to the procedure, a History and Physical                            was performed, and patient medications and                            allergies were reviewed. The patient's tolerance of                            previous anesthesia was also reviewed. The risks                            and benefits of the procedure and the sedation                            options and risks were discussed with the patient.                            All questions were answered, and informed consent                            was obtained. Prior Anticoagulants: The patient has                            taken no anticoagulant or antiplatelet agents. ASA                            Grade Assessment: II - A patient with mild systemic  disease. After reviewing the risks and benefits,                            the patient was deemed in satisfactory condition to                            undergo the procedure.                           After obtaining informed consent, the colonoscope                            was passed under direct vision. Throughout the                            procedure, the patient's blood pressure, pulse,  and                            oxygen saturations were monitored continuously. The                            Olympus Scope SN [id number] was introduced through the                            anus and advanced to the the cecum, identified by                            appendiceal orifice and ileocecal valve. The                            colonoscopy was performed without difficulty. The                            patient tolerated the procedure well. The quality                            of the bowel preparation was adequate to identify                            polyps greater than 5 mm in size. The ileocecal                            valve, appendiceal orifice, and rectum were                            photographed. Scope In: 11:44:03 AM Scope Out: 12:12:21 PM Scope Withdrawal Time: 0 hours 19 minutes 3 seconds  Total Procedure Duration: 0 hours 28 minutes 18 seconds  Findings:                 The perianal and digital rectal examinations were                            normal.  Four sessile polyps were found in the rectum and                            sigmoid colon. The polyps were 4 to 6 mm in size.                            These polyps were removed with a cold snare.                            Resection and retrieval were complete.                           Scattered small-mouthed diverticula were found in                            the sigmoid colon.                           Non-bleeding external and internal hemorrhoids were                            found during retroflexion. The hemorrhoids were                            medium-sized. Complications:            No immediate complications. Estimated Blood Loss:     Estimated blood loss was minimal. Impression:               - Four 4 to 6 mm polyps in the rectum and in the                            sigmoid colon, removed with a cold snare. Resected                            and retrieved.                            - Diverticulosis in the sigmoid colon.                           - Non-bleeding external and internal hemorrhoids. Recommendation:           - Patient has a contact number available for                            emergencies. The signs and symptoms of potential                            delayed complications were discussed with the                            patient. Return to normal activities tomorrow.                            Written  discharge instructions were provided to the                            patient.                           - Resume previous diet.                           - Continue present medications.                           - Await pathology results.                           - Repeat colonoscopy in 3 - 5 years for                            surveillance based on pathology results.                           - For future colonoscopy the patient will require                            an extended preparation. If there are any                            questions, please contact the gastroenterologist. Gustav ALONSO Mcgee, MD 10/23/2023 12:31:09 PM This report has been signed electronically.

## 2023-10-26 ENCOUNTER — Telehealth: Payer: Self-pay

## 2023-10-26 NOTE — Telephone Encounter (Signed)
  Follow up Call-     10/23/2023   10:44 AM  Call back number  Post procedure Call Back phone  # [phone]  Permission to leave phone message Yes     Patient questions:  Do you have a fever, pain , or abdominal swelling? No. Pain Score  0 *  Have you tolerated food without any problems? Yes.    Have you been able to return to your normal activities? Yes.    Do you have any questions about your discharge instructions: Diet   No. Medications  No. Follow up visit  No.  Do you have questions or concerns about your Care? Yes.    Actions: * If pain score is 4 or above: No action needed, pain <4.  Patient does not report any other problems, but did mention she has noted a fair amount of bleeding both with and without a bowel movement over the weekend, a total of about 3 incidents, no blood clots noted. She states she has not had any bleeding since yesterday. Advised patient to call us  immediately if the bleeding resumes. Patient verbalizes understanding and states she will do so.  Reports no other concerns/symptoms.

## 2023-10-26 NOTE — Telephone Encounter (Signed)
 Called patient to follow-up, unable to reach left a voice message. Brandi, can you please check tomorrow a.m. to make sure she is not having further bleeding?  Thank you

## 2023-10-27 LAB — SURGICAL PATHOLOGY

## 2023-10-27 NOTE — Telephone Encounter (Signed)
 Spoke with pt. She reports that she has not had any more bleeding. She is feeling better. Advised pt that if she had any further issues, to please let us  know. Pt verbalized understanding.

## 2023-10-27 NOTE — Telephone Encounter (Signed)
 Ok, thank you

## 2023-10-28 ENCOUNTER — Ambulatory Visit: Payer: Self-pay | Admitting: Gastroenterology

## 2023-11-03 ENCOUNTER — Other Ambulatory Visit: Payer: Self-pay | Admitting: Obstetrics and Gynecology

## 2023-11-03 DIAGNOSIS — Z1231 Encounter for screening mammogram for malignant neoplasm of breast: Secondary | ICD-10-CM

## 2023-12-14 ENCOUNTER — Telehealth: Payer: Self-pay | Admitting: Family Medicine

## 2023-12-14 ENCOUNTER — Other Ambulatory Visit: Payer: Self-pay

## 2023-12-14 DIAGNOSIS — M5416 Radiculopathy, lumbar region: Secondary | ICD-10-CM

## 2023-12-14 NOTE — Telephone Encounter (Signed)
 Pt called, back pain has returned and she is scheduled for 02/01/2023. She stopped taking the gabapentin  as she read a study linking it to dementia.  Last epidural 10/14/2023, only lasted about 1 month. Previous epidural have done better.  Would we recommend another epidural before her visit or an alternate med to the gabapentin ?

## 2023-12-23 NOTE — Discharge Instructions (Signed)

## 2023-12-24 ENCOUNTER — Inpatient Hospital Stay
Admission: RE | Admit: 2023-12-24 | Discharge: 2023-12-24 | Disposition: A | Source: Ambulatory Visit | Attending: Family Medicine | Admitting: Family Medicine

## 2023-12-24 DIAGNOSIS — M5416 Radiculopathy, lumbar region: Secondary | ICD-10-CM

## 2023-12-24 MED ORDER — METHYLPREDNISOLONE ACETATE 40 MG/ML INJ SUSP (RADIOLOG
80.0000 mg | Freq: Once | INTRAMUSCULAR | Status: AC
  Administered 2023-12-24: 80 mg via EPIDURAL

## 2023-12-24 MED ORDER — IOPAMIDOL (ISOVUE-M 200) INJECTION 41%
1.0000 mL | Freq: Once | INTRAMUSCULAR | Status: AC
  Administered 2023-12-24: 1 mL via EPIDURAL

## 2023-12-28 ENCOUNTER — Ambulatory Visit
Admission: RE | Admit: 2023-12-28 | Discharge: 2023-12-28 | Disposition: A | Source: Ambulatory Visit | Attending: Obstetrics and Gynecology | Admitting: Obstetrics and Gynecology

## 2023-12-28 DIAGNOSIS — Z1231 Encounter for screening mammogram for malignant neoplasm of breast: Secondary | ICD-10-CM

## 2024-01-29 NOTE — Progress Notes (Unsigned)
 " [name] [address] [place] [address] Phone: [phone] Subjective:    I'm seeing this patient by the request  of:  Berneta Elsie Sayre, MD  CC: Low back pain follow-up  YEP:Dlagzrupcz  04/29/2023 Patient does have some radicular symptoms that has improved significantly with the injection.  Discussed icing regimen and home exercises, discussed which activities to do and which ones to avoid.  At this point with patient responding so well to the epidural we can repeat as needed.  Continue to increase activity slowly.  Follow-up with me again more on an as-needed basis.     Update 02/01/2024 Mary [name] is a 69 y.o. female coming in with complaint of lumbar radiculopathy. 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  Significant facet arthropathy.  Large calcified uterine fibroid also noted.  Last epidural was December 4   Past medical history significant for bilateral hip replacements.  Past Medical History:  Diagnosis Date   Arthritis    Complication of anesthesia    Family history of adverse reaction to anesthesia    mother- syncope, N/V   Pneumonia    hx of years ago   PONV (postoperative nausea and vomiting)    Past Surgical History:  Procedure Laterality Date   COLONOSCOPY     HARDWARE REMOVAL Left 11/20/2020   Procedure: LEFT HIP HARDWARE REMOVAL;  Surgeon: Sheril Coy, MD;  Location: [facility];  Service: Orthopedics;  Laterality: Left;   HIP FRACTURE SURGERY Left    right hip replacement      Social History   Socioeconomic History   Marital status: Divorced    Spouse name: Not on file   Number of children: Not on file   Years of education: Not on file   Highest education level: Not on file  Occupational History   Not on file  Tobacco Use   Smoking status: Former    Current packs/day: 0.00    Types: Cigarettes    Quit date: 01/20/1986    Years  since quitting: 38.0   Smokeless tobacco: Never  Vaping Use   Vaping status: Never Used  Substance and Sexual Activity   Alcohol use: Yes    Alcohol/week: 7.0 standard drinks of alcohol    Types: 7 Glasses of wine per week    Comment: glass wine every day   Drug use: Never   Sexual activity: Not on file  Other Topics Concern   Not on file  Social History Narrative   Not on file   Social Drivers of Health   Tobacco Use: Medium Risk (10/23/2023)   Patient History    Smoking Tobacco Use: Former    Smokeless Tobacco Use: Never    Passive Exposure: Not on Actuary Strain: Low Risk (08/28/2023)   Overall Financial Resource Strain (CARDIA)    Difficulty of Paying Living Expenses: Not hard at all  Food Insecurity: No Food Insecurity (08/28/2023)   Epic    Worried About Radiation Protection Practitioner of Food in the Last Year: Never true    Ran Out of Food in the Last Year: Never true  Transportation Needs: No Transportation Needs (08/28/2023)   Epic    Lack of Transportation (Medical): No    Lack of Transportation (Non-Medical): No  Physical Activity: Sufficiently Active (08/28/2023)   Exercise Vital Sign    Days of Exercise per Week: 7  days    Minutes of Exercise per Session: 60 min  Stress: No Stress Concern Present (08/28/2023)   [employer] of Occupational Health - Occupational Stress Questionnaire    Feeling of Stress: Only a little  Social Connections: Moderately Isolated (08/28/2023)   Social Connection and Isolation Panel    Frequency of Communication with Friends and Family: More than three times a week    Frequency of Social Gatherings with Friends and Family: More than three times a week    Attends Religious Services: Never    Database Administrator or Organizations: Yes    Attends Engineer, Structural: More than 4 times per year    Marital Status: Divorced  Depression (PHQ2-9): Low Risk (09/01/2023)   Depression (PHQ2-9)    PHQ-2 Score: 0  Alcohol Screen: Low Risk  (08/28/2023)   Alcohol Screen    Last Alcohol Screening Score (AUDIT): 4  Housing: Unknown (08/28/2023)   Epic    Unable to Pay for Housing in the Last Year: No    Number of Times Moved in the Last Year: Not on file    Homeless in the Last Year: No  Utilities: Not At Risk (08/28/2023)   Epic    Threatened with loss of utilities: No  Health Literacy: Adequate Health Literacy (08/28/2023)   B1300 Health Literacy    Frequency of need for help with medical instructions: Never   Allergies[1] Family History  Problem Relation Age of Onset   Colon cancer Mother    Breast cancer Mother        early 12's   Esophageal cancer Neg Hx    Rectal cancer Neg Hx    Stomach cancer Neg Hx     Current Outpatient Medications (Other):    CALCIUM PO, Take 1 tablet by mouth daily.   carbamide peroxide (DEBROX) 6.5 % OTIC solution, Place 5 drops into both ears 2 (two) times daily.   cholecalciferol (VITAMIN D3) 25 MCG (1000 UNIT) tablet, Take 1,000 Units by mouth daily. (Patient not taking: Reported on 10/23/2023)   gabapentin  (NEURONTIN ) 100 MG capsule, TAKE 1 CAPSULE BY MOUTH 2 TIMES DAILY.   Multiple Vitamins-Minerals (WOMENS MULTIVITAMIN PO), Take by mouth.   tetrahydrozoline 0.05 % ophthalmic solution, Place 1 drop into both eyes daily as needed (dry eyes).   Reviewed prior external information including notes and imaging from  primary care provider As well as notes that were available from care everywhere and other healthcare systems.  Past medical history, social, surgical and family history all reviewed in electronic medical record.  No pertanent information unless stated regarding to the chief complaint.   Review of Systems:  No headache, visual changes, nausea, vomiting, diarrhea, constipation, dizziness, abdominal pain, skin rash, fevers, chills, night sweats, weight loss, swollen lymph nodes, body aches, joint swelling, chest pain, shortness of breath, mood changes. POSITIVE muscle  aches  Objective  There were no vitals taken for this visit.   General: No apparent distress alert and oriented x3 mood and affect normal, dressed appropriately.  HEENT: Pupils equal, extraocular movements intact  Respiratory: Patient's speak in full sentences and does not appear short of breath  Cardiovascular: No lower extremity edema, non tender, no erythema  Low back exam shows    Impression and Recommendations:     The above documentation has been reviewed and is accurate and complete Caileb Rhue M Jewelz Ricklefs, DO       [1] No Known Allergies  "

## 2024-02-01 ENCOUNTER — Ambulatory Visit: Admitting: Family Medicine

## 2024-02-09 NOTE — Telephone Encounter (Signed)
 Patient called stating that her back pain has returned.  She would like to know what next steps might be.  She was previously taking Gabapentin  but was making her feel foggy.  She had tried some Meloxicam and said that is seemed to help. So she would like to know if this is something Dr Claudene could prescribe for the meantime?  Pharmacy: [facility]

## 2024-02-11 ENCOUNTER — Other Ambulatory Visit: Payer: Self-pay

## 2024-02-11 DIAGNOSIS — M545 Low back pain, unspecified: Secondary | ICD-10-CM

## 2024-02-17 NOTE — Progress Notes (Unsigned)
 " [name] [address] [place] [address] Phone: [phone] Subjective:    I'm seeing this patient by the request  of:  Berneta Elsie Sayre, MD  CC: Low back pain follow-up  YEP:Dlagzrupcz  04/29/2023 Patient does have some radicular symptoms that has improved significantly with the injection.  Discussed icing regimen and home exercises, discussed which activities to do and which ones to avoid.  At this point with patient responding so well to the epidural we can repeat as needed.  Continue to increase activity slowly.  Follow-up with me again more on an as-needed basis.     Update 02/23/2024 Mary [name] is a 69 y.o. female coming in with complaint of lumbar radiculopathy. 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  Significant facet arthropathy.  Large calcified uterine fibroid also noted.  Last epidural was December 4   Past medical history significant for bilateral hip replacements.  Past Medical History:  Diagnosis Date   Arthritis    Complication of anesthesia    Family history of adverse reaction to anesthesia    mother- syncope, N/V   Pneumonia    hx of years ago   PONV (postoperative nausea and vomiting)    Past Surgical History:  Procedure Laterality Date   COLONOSCOPY     HARDWARE REMOVAL Left 11/20/2020   Procedure: LEFT HIP HARDWARE REMOVAL;  Surgeon: Sheril Coy, MD;  Location: [facility];  Service: Orthopedics;  Laterality: Left;   HIP FRACTURE SURGERY Left    right hip replacement      Social History   Socioeconomic History   Marital status: Divorced    Spouse name: Not on file   Number of children: Not on file   Years of education: Not on file   Highest education level: Not on file  Occupational History   Not on file  Tobacco Use   Smoking status: Former    Current packs/day: 0.00    Types: Cigarettes    Quit date: 01/20/1986    Years  since quitting: 38.0   Smokeless tobacco: Never  Vaping Use   Vaping status: Never Used  Substance and Sexual Activity   Alcohol use: Yes    Alcohol/week: 7.0 standard drinks of alcohol    Types: 7 Glasses of wine per week    Comment: glass wine every day   Drug use: Never   Sexual activity: Not on file  Other Topics Concern   Not on file  Social History Narrative   Not on file   Social Drivers of Health   Tobacco Use: Medium Risk (10/23/2023)   Patient History    Smoking Tobacco Use: Former    Smokeless Tobacco Use: Never    Passive Exposure: Not on Actuary Strain: Low Risk (08/28/2023)   Overall Financial Resource Strain (CARDIA)    Difficulty of Paying Living Expenses: Not hard at all  Food Insecurity: No Food Insecurity (08/28/2023)   Epic    Worried About Radiation Protection Practitioner of Food in the Last Year: Never true    Ran Out of Food in the Last Year: Never true  Transportation Needs: No Transportation Needs (08/28/2023)   Epic    Lack of Transportation (Medical): No    Lack of Transportation (Non-Medical): No  Physical Activity: Sufficiently Active (08/28/2023)   Exercise Vital Sign    Days of Exercise per Week: 7  days    Minutes of Exercise per Session: 60 min  Stress: No Stress Concern Present (08/28/2023)   [employer] of Occupational Health - Occupational Stress Questionnaire    Feeling of Stress: Only a little  Social Connections: Moderately Isolated (08/28/2023)   Social Connection and Isolation Panel    Frequency of Communication with Friends and Family: More than three times a week    Frequency of Social Gatherings with Friends and Family: More than three times a week    Attends Religious Services: Never    Database Administrator or Organizations: Yes    Attends Engineer, Structural: More than 4 times per year    Marital Status: Divorced  Depression (PHQ2-9): Low Risk (09/01/2023)   Depression (PHQ2-9)    PHQ-2 Score: 0  Alcohol Screen: Low Risk  (08/28/2023)   Alcohol Screen    Last Alcohol Screening Score (AUDIT): 4  Housing: Unknown (08/28/2023)   Epic    Unable to Pay for Housing in the Last Year: No    Number of Times Moved in the Last Year: Not on file    Homeless in the Last Year: No  Utilities: Not At Risk (08/28/2023)   Epic    Threatened with loss of utilities: No  Health Literacy: Adequate Health Literacy (08/28/2023)   B1300 Health Literacy    Frequency of need for help with medical instructions: Never   [Allergies]  [Allergies] No Known Allergies Family History  Problem Relation Age of Onset   Colon cancer Mother    Breast cancer Mother        early 66's   Esophageal cancer Neg Hx    Rectal cancer Neg Hx    Stomach cancer Neg Hx     Current Outpatient Medications (Other):    CALCIUM PO, Take 1 tablet by mouth daily.   carbamide peroxide (DEBROX) 6.5 % OTIC solution, Place 5 drops into both ears 2 (two) times daily.   cholecalciferol (VITAMIN D3) 25 MCG (1000 UNIT) tablet, Take 1,000 Units by mouth daily. (Patient not taking: Reported on 10/23/2023)   gabapentin  (NEURONTIN ) 100 MG capsule, TAKE 1 CAPSULE BY MOUTH 2 TIMES DAILY.   Multiple Vitamins-Minerals (WOMENS MULTIVITAMIN PO), Take by mouth.   tetrahydrozoline 0.05 % ophthalmic solution, Place 1 drop into both eyes daily as needed (dry eyes).   Reviewed prior external information including notes and imaging from  primary care provider As well as notes that were available from care everywhere and other healthcare systems.  Past medical history, social, surgical and family history all reviewed in electronic medical record.  No pertanent information unless stated regarding to the chief complaint.   Review of Systems:  No headache, visual changes, nausea, vomiting, diarrhea, constipation, dizziness, abdominal pain, skin rash, fevers, chills, night sweats, weight loss, swollen lymph nodes, body aches, joint swelling, chest pain, shortness of breath, mood  changes. POSITIVE muscle aches  Objective  There were no vitals taken for this visit.   General: No apparent distress alert and oriented x3 mood and affect normal, dressed appropriately.  HEENT: Pupils equal, extraocular movements intact  Respiratory: Patient's speak in full sentences and does not appear short of breath  Cardiovascular: No lower extremity edema, non tender, no erythema  Low back exam shows    Impression and Recommendations:     The above documentation has been reviewed and is accurate and complete Zachary M Smith, DO   "

## 2024-02-23 ENCOUNTER — Ambulatory Visit: Admitting: Family Medicine

## 2024-02-25 ENCOUNTER — Inpatient Hospital Stay: Admission: RE | Admit: 2024-02-25 | Discharge: 2024-02-25 | Attending: Family Medicine | Admitting: Family Medicine

## 2024-02-25 ENCOUNTER — Other Ambulatory Visit

## 2024-02-25 DIAGNOSIS — M545 Low back pain, unspecified: Secondary | ICD-10-CM

## 2024-02-26 ENCOUNTER — Other Ambulatory Visit: Payer: Self-pay

## 2024-02-26 ENCOUNTER — Ambulatory Visit: Payer: Self-pay | Admitting: Family Medicine

## 2024-02-26 ENCOUNTER — Telehealth: Payer: Self-pay | Admitting: Family Medicine

## 2024-02-26 DIAGNOSIS — M5416 Radiculopathy, lumbar region: Secondary | ICD-10-CM

## 2024-02-26 NOTE — Discharge Instructions (Signed)

## 2024-02-26 NOTE — Telephone Encounter (Signed)
 Patient called and states she could not figure out how to respond to Dr. Claudene about the epidural but she would like to do another one. FYI

## 2024-02-29 ENCOUNTER — Inpatient Hospital Stay: Admission: RE | Admit: 2024-02-29 | Source: Ambulatory Visit

## 2024-03-21 ENCOUNTER — Ambulatory Visit: Admitting: Family Medicine

## 2024-09-09 ENCOUNTER — Ambulatory Visit
